# Patient Record
Sex: Male | Born: 1980 | Race: Black or African American | Hispanic: No | Marital: Single | Smoking: Never smoker
Health system: Southern US, Community
[De-identification: ages and names within clinical notes are randomized; demographics above are authoritative.]

## PROBLEM LIST (undated history)

## (undated) DIAGNOSIS — I10 Essential (primary) hypertension: Secondary | ICD-10-CM

## (undated) DIAGNOSIS — I2699 Other pulmonary embolism without acute cor pulmonale: Secondary | ICD-10-CM

---

## 2006-10-20 ENCOUNTER — Emergency Department (HOSPITAL_COMMUNITY): Admission: EM | Admit: 2006-10-20 | Discharge: 2006-10-20 | Payer: Self-pay | Admitting: Emergency Medicine

## 2009-03-08 ENCOUNTER — Emergency Department (HOSPITAL_COMMUNITY): Admission: EM | Admit: 2009-03-08 | Discharge: 2009-03-08 | Payer: Self-pay | Admitting: Emergency Medicine

## 2010-04-08 LAB — BASIC METABOLIC PANEL
BUN: 15 mg/dL (ref 6–23)
Chloride: 100 mEq/L (ref 96–112)
Creatinine, Ser: 1 mg/dL (ref 0.4–1.5)
GFR calc non Af Amer: >60 mL/min (ref >60)
Glucose, Bld: 91 mg/dL (ref 70–99)
Potassium: 3.9 mEq/L (ref 3.5–5.1)
Sodium: 137 mEq/L (ref 135–145)

## 2010-04-08 LAB — DIFFERENTIAL
Basophils Relative: 1 % (ref 0–1)
Eosinophils Absolute: 0.2 10*3/uL (ref 0.0–0.7)
Monocytes Absolute: 0.4 10*3/uL (ref 0.1–1.0)
Neutrophils Relative %: 54 % (ref 43–77)

## 2010-04-08 LAB — CK TOTAL AND CKMB (NOT AT ARMC)
Relative Index: 0.2 (ref 0.0–2.5)
Total CK: 474 U/L — ABNORMAL HIGH (ref 7–232)

## 2010-04-08 LAB — CBC
HCT: 47.1 % (ref 39.0–52.0)
MCV: 89.9 fL (ref 78.0–100.0)
Platelets: 250 10*3/uL (ref 150–400)
RDW: 12.4 % (ref 11.5–15.5)

## 2010-04-08 LAB — TROPONIN I: Troponin I: 0.02 ng/mL (ref 0.00–0.06)

## 2010-04-08 LAB — D-DIMER, QUANTITATIVE: D-Dimer, Quant: 0.54 ug/mL-FEU — ABNORMAL HIGH (ref 0.00–0.48)

## 2010-10-27 LAB — RAPID STREP SCREEN (MED CTR MEBANE ONLY): Streptococcus, Group A Screen (Direct): NEGATIVE

## 2011-02-13 ENCOUNTER — Emergency Department: Payer: Self-pay | Admitting: Emergency Medicine

## 2012-10-10 DIAGNOSIS — R739 Hyperglycemia, unspecified: Secondary | ICD-10-CM | POA: Insufficient documentation

## 2012-10-19 DIAGNOSIS — R6510 Systemic inflammatory response syndrome (SIRS) of non-infectious origin without acute organ dysfunction: Secondary | ICD-10-CM | POA: Insufficient documentation

## 2012-10-19 DIAGNOSIS — I2699 Other pulmonary embolism without acute cor pulmonale: Secondary | ICD-10-CM | POA: Insufficient documentation

## 2012-10-30 DIAGNOSIS — I10 Essential (primary) hypertension: Secondary | ICD-10-CM | POA: Insufficient documentation

## 2017-11-23 ENCOUNTER — Other Ambulatory Visit: Payer: Self-pay

## 2017-11-23 ENCOUNTER — Emergency Department
Admission: EM | Admit: 2017-11-23 | Discharge: 2017-11-23 | Disposition: A | Discharge status: Home / Self Care | Payer: No Typology Code available for payment source | Attending: Emergency Medicine | Admitting: Emergency Medicine

## 2017-11-23 ENCOUNTER — Encounter: Payer: Self-pay | Admitting: Emergency Medicine

## 2017-11-23 ENCOUNTER — Emergency Department: Payer: No Typology Code available for payment source

## 2017-11-23 DIAGNOSIS — M25512 Pain in left shoulder: Secondary | ICD-10-CM | POA: Diagnosis present

## 2017-11-23 DIAGNOSIS — I10 Essential (primary) hypertension: Secondary | ICD-10-CM | POA: Diagnosis not present

## 2017-11-23 HISTORY — DX: Essential (primary) hypertension: I10

## 2017-11-23 MED ORDER — TRAMADOL HCL 50 MG PO TABS
50.0000 mg | ORAL_TABLET | Freq: Once | ORAL | Status: AC
  Administered 2017-11-23: 50 mg via ORAL
  Filled 2017-11-23: qty 1

## 2017-11-23 MED ORDER — CYCLOBENZAPRINE HCL 5 MG PO TABS: ORAL_TABLET | ORAL | 0 refills | Status: DC

## 2017-11-23 MED ORDER — NAPROXEN 500 MG PO TABS: 500.0000 mg | ORAL_TABLET | Freq: Two times a day (BID) | ORAL | 0 refills | Status: DC
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
## 2017-11-29 ENCOUNTER — Ambulatory Visit
Admission: EM | Admit: 2017-11-29 | Discharge: 2017-11-29 | Disposition: A | Discharge status: Home / Self Care | Payer: Self-pay | Attending: Family Medicine | Admitting: Family Medicine

## 2017-11-29 ENCOUNTER — Encounter: Payer: Self-pay | Admitting: Gynecology

## 2017-11-29 DIAGNOSIS — S46812A Strain of other muscles, fascia and tendons at shoulder and upper arm level, left arm, initial encounter: Secondary | ICD-10-CM

## 2017-11-29 DIAGNOSIS — Y92410 Unspecified street and highway as the place of occurrence of the external cause: Secondary | ICD-10-CM

## 2017-11-29 DIAGNOSIS — M545 Low back pain, unspecified: Secondary | ICD-10-CM

## 2017-11-29 MED ORDER — MELOXICAM 15 MG PO TABS: 15.0000 mg | ORAL_TABLET | Freq: Every day | ORAL | 0 refills | Status: DC | PRN

## 2017-11-29 MED ORDER — METAXALONE 800 MG PO TABS: 800.0000 mg | ORAL_TABLET | Freq: Three times a day (TID) | ORAL | 0 refills | Status: DC | PRN

## 2017-11-29 NOTE — Discharge Instructions (Signed)
Meds as prescribed. ° °Take care ° °Dr. Jdyn Parkerson  °
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
## 2018-03-16 ENCOUNTER — Emergency Department: Payer: Self-pay

## 2018-03-16 ENCOUNTER — Inpatient Hospital Stay
Admission: EM | Admit: 2018-03-16 | Discharge: 2018-03-17 | DRG: 176 | Disposition: A | Discharge status: Home / Self Care | Payer: Self-pay | Attending: Internal Medicine | Admitting: Internal Medicine

## 2018-03-16 ENCOUNTER — Other Ambulatory Visit: Payer: Self-pay

## 2018-03-16 ENCOUNTER — Encounter: Payer: Self-pay | Admitting: Emergency Medicine

## 2018-03-16 DIAGNOSIS — D751 Secondary polycythemia: Secondary | ICD-10-CM | POA: Diagnosis present

## 2018-03-16 DIAGNOSIS — I2699 Other pulmonary embolism without acute cor pulmonale: Principal | ICD-10-CM | POA: Diagnosis present

## 2018-03-16 DIAGNOSIS — R079 Chest pain, unspecified: Secondary | ICD-10-CM

## 2018-03-16 DIAGNOSIS — I10 Essential (primary) hypertension: Secondary | ICD-10-CM | POA: Diagnosis present

## 2018-03-16 DIAGNOSIS — X58XXXA Exposure to other specified factors, initial encounter: Secondary | ICD-10-CM | POA: Diagnosis present

## 2018-03-16 DIAGNOSIS — I429 Cardiomyopathy, unspecified: Secondary | ICD-10-CM | POA: Diagnosis present

## 2018-03-16 DIAGNOSIS — Z79899 Other long term (current) drug therapy: Secondary | ICD-10-CM

## 2018-03-16 DIAGNOSIS — Z91120 Patient's intentional underdosing of medication regimen due to financial hardship: Secondary | ICD-10-CM

## 2018-03-16 DIAGNOSIS — Z86711 Personal history of pulmonary embolism: Secondary | ICD-10-CM

## 2018-03-16 DIAGNOSIS — T45516A Underdosing of anticoagulants, initial encounter: Secondary | ICD-10-CM | POA: Diagnosis present

## 2018-03-16 DIAGNOSIS — Z888 Allergy status to other drugs, medicaments and biological substances status: Secondary | ICD-10-CM

## 2018-03-16 DIAGNOSIS — J181 Lobar pneumonia, unspecified organism: Secondary | ICD-10-CM

## 2018-03-16 DIAGNOSIS — R739 Hyperglycemia, unspecified: Secondary | ICD-10-CM | POA: Diagnosis present

## 2018-03-16 DIAGNOSIS — J189 Pneumonia, unspecified organism: Secondary | ICD-10-CM

## 2018-03-16 DIAGNOSIS — Z7982 Long term (current) use of aspirin: Secondary | ICD-10-CM

## 2018-03-16 HISTORY — DX: Other pulmonary embolism without acute cor pulmonale: I26.99

## 2018-03-16 LAB — HEPATIC FUNCTION PANEL
ALK PHOS: 62 U/L (ref 38–126)
ALT: 14 U/L (ref 0–44)
AST: 18 U/L (ref 15–41)
Albumin: 4.7 g/dL (ref 3.5–5.0)
BILIRUBIN TOTAL: 0.9 mg/dL (ref 0.3–1.2)
Bilirubin, Direct: 0.3 mg/dL — ABNORMAL HIGH (ref 0.0–0.2)
Indirect Bilirubin: 0.6 mg/dL (ref 0.3–0.9)
Total Protein: 10.2 g/dL — ABNORMAL HIGH (ref 6.5–8.1)

## 2018-03-16 LAB — URINALYSIS, ROUTINE W REFLEX MICROSCOPIC
Bacteria, UA: NONE SEEN
Bilirubin Urine: NEGATIVE
GLUCOSE, UA: NEGATIVE mg/dL
KETONES UR: 20 mg/dL — AB
Leukocytes,Ua: NEGATIVE
Nitrite: NEGATIVE
PH: 6 (ref 5.0–8.0)
Protein, ur: 100 mg/dL — AB
Specific Gravity, Urine: >1.046 — ABNORMAL HIGH (ref 1.005–1.030)
Squamous Epithelial / HPF: NONE SEEN (ref 0–5)

## 2018-03-16 LAB — PROTIME-INR
INR: 1.1 (ref 0.8–1.2)
Prothrombin Time: 14.1 seconds (ref 11.4–15.2)

## 2018-03-16 LAB — BASIC METABOLIC PANEL
ANION GAP: 13 (ref 5–15)
BUN: 17 mg/dL (ref 6–20)
CHLORIDE: 99 mmol/L (ref 98–111)
CO2: 26 mmol/L (ref 22–32)
Calcium: 10 mg/dL (ref 8.9–10.3)
Creatinine, Ser: 1.29 mg/dL — ABNORMAL HIGH (ref 0.61–1.24)
GFR calc non Af Amer: >60 mL/min (ref >60)
GLUCOSE: 109 mg/dL — AB (ref 70–99)
Potassium: 3.9 mmol/L (ref 3.5–5.1)
Sodium: 138 mmol/L (ref 135–145)

## 2018-03-16 LAB — TROPONIN I
Troponin I: 0.05 ng/mL (ref <0.03)
Troponin I: <0.03 ng/mL (ref <0.03)

## 2018-03-16 LAB — CBC
HEMATOCRIT: 52.6 % — AB (ref 39.0–52.0)
Hemoglobin: 17.7 g/dL — ABNORMAL HIGH (ref 13.0–17.0)
MCH: 29.3 pg (ref 26.0–34.0)
MCHC: 33.7 g/dL (ref 30.0–36.0)
MCV: 87.1 fL (ref 80.0–100.0)
NRBC: 0 % (ref 0.0–0.2)
Platelets: 280 10*3/uL (ref 150–400)
RBC: 6.04 MIL/uL — AB (ref 4.22–5.81)
RDW: 12 % (ref 11.5–15.5)
WBC: 11 10*3/uL — ABNORMAL HIGH (ref 4.0–10.5)

## 2018-03-16 LAB — MAGNESIUM: Magnesium: 2.1 mg/dL (ref 1.7–2.4)

## 2018-03-16 LAB — APTT: APTT: 31 s (ref 24–36)

## 2018-03-16 LAB — PHOSPHORUS: Phosphorus: 3.3 mg/dL (ref 2.5–4.6)

## 2018-03-16 LAB — LIPASE, BLOOD: Lipase: 29 U/L (ref 11–51)

## 2018-03-16 MED ORDER — ENOXAPARIN SODIUM 120 MG/0.8ML ~~LOC~~ SOLN
1.0000 mg/kg | Freq: Two times a day (BID) | SUBCUTANEOUS | Status: DC
  Administered 2018-03-17: 105 mg via SUBCUTANEOUS
  Filled 2018-03-16: qty 0.8

## 2018-03-16 MED ORDER — LACTATED RINGERS IV SOLN
INTRAVENOUS | Status: AC
  Administered 2018-03-17: via INTRAVENOUS

## 2018-03-16 MED ORDER — ASPIRIN EC 325 MG PO TBEC
325.0000 mg | DELAYED_RELEASE_TABLET | Freq: Once | ORAL | Status: AC
  Administered 2018-03-16: 325 mg via ORAL
  Filled 2018-03-16: qty 1

## 2018-03-16 MED ORDER — MORPHINE SULFATE (PF) 2 MG/ML IV SOLN: 2.0000 mg | INTRAVENOUS | Status: DC | PRN

## 2018-03-16 MED ORDER — ASPIRIN EC 81 MG PO TBEC
81.0000 mg | DELAYED_RELEASE_TABLET | Freq: Once | ORAL | Status: DC
  Filled 2018-03-16: qty 1

## 2018-03-16 MED ORDER — MORPHINE SULFATE (PF) 4 MG/ML IV SOLN
INTRAVENOUS | Status: AC
  Administered 2018-03-17: 4 mg via INTRAVENOUS
  Filled 2018-03-16: qty 1

## 2018-03-16 MED ORDER — NITROGLYCERIN 0.4 MG SL SUBL: 0.4000 mg | SUBLINGUAL_TABLET | SUBLINGUAL | Status: DC | PRN

## 2018-03-16 MED ORDER — ASPIRIN 81 MG PO CHEW
81.0000 mg | CHEWABLE_TABLET | Freq: Every day | ORAL | Status: DC
  Administered 2018-03-17: 81 mg via ORAL
  Filled 2018-03-16: qty 1

## 2018-03-16 MED ORDER — IOHEXOL 350 MG/ML SOLN
75.0000 mL | Freq: Once | INTRAVENOUS | Status: AC | PRN
  Administered 2018-03-16: 75 mL via INTRAVENOUS

## 2018-03-16 MED ORDER — SODIUM CHLORIDE 0.9 % IV BOLUS
1000.0000 mL | Freq: Once | INTRAVENOUS | Status: AC
  Administered 2018-03-16: 1000 mL via INTRAVENOUS

## 2018-03-16 MED ORDER — SODIUM CHLORIDE 0.9% FLUSH
3.0000 mL | Freq: Once | INTRAVENOUS | Status: AC
  Administered 2018-03-16: 3 mL via INTRAVENOUS

## 2018-03-16 MED ORDER — BENZONATATE 100 MG PO CAPS
200.0000 mg | ORAL_CAPSULE | Freq: Once | ORAL | Status: AC
  Administered 2018-03-16: 200 mg via ORAL
  Filled 2018-03-16: qty 2

## 2018-03-16 MED ORDER — SODIUM CHLORIDE 0.9 % IV SOLN
500.0000 mg | Freq: Once | INTRAVENOUS | Status: AC
  Administered 2018-03-17: 500 mg via INTRAVENOUS
  Filled 2018-03-16 (×2): qty 500

## 2018-03-16 MED ORDER — SODIUM CHLORIDE 0.9 % IV SOLN
1.0000 g | INTRAVENOUS | Status: DC
  Filled 2018-03-16: qty 10

## 2018-03-16 MED ORDER — SODIUM CHLORIDE 0.9 % IV SOLN
500.0000 mg | INTRAVENOUS | Status: DC
  Filled 2018-03-16: qty 500

## 2018-03-16 MED ORDER — MORPHINE SULFATE (PF) 4 MG/ML IV SOLN
4.0000 mg | INTRAVENOUS | Status: DC | PRN
  Administered 2018-03-16 – 2018-03-17 (×3): 4 mg via INTRAVENOUS
  Filled 2018-03-16 (×2): qty 1

## 2018-03-16 MED ORDER — ENOXAPARIN SODIUM 120 MG/0.8ML ~~LOC~~ SOLN
1.0000 mg/kg | Freq: Once | SUBCUTANEOUS | Status: AC
  Administered 2018-03-16: 105 mg via SUBCUTANEOUS
  Filled 2018-03-16: qty 0.8

## 2018-03-16 MED ORDER — SODIUM CHLORIDE 0.9 % IV SOLN
1.0000 g | Freq: Once | INTRAVENOUS | Status: AC
  Administered 2018-03-16: 1 g via INTRAVENOUS
  Filled 2018-03-16: qty 10

## 2018-03-16 NOTE — ED Provider Notes (Signed)
[facility] Emergency Department Provider Note  Time seen: 8:20 PM  I have reviewed the triage vital signs and the nursing notes.   HISTORY  Chief Complaint Shortness of Breath    HPI Keith Norman is a 38 y.o. male the past medical history of hypertension presents emergency department for shortness of breath and left lower chest/left upper quadrant abdominal pain.  According to the patient for the past 4 days he has been experiencing pain in his left lower chest as well as left upper part of his abdomen.  Patient states he has been coughing with mucus production at times.  Denies any fever.  States he was feeling short of breath today which is what prompted his ER visit.  No history of heart disease.  No leg pain or swelling.  Largely negative review of systems.   Past Medical History:  Diagnosis Date  . Hypertension     There are no active problems to display for this patient.   History reviewed. No pertinent surgical history.  Prior to Admission medications   Medication Sig Start Date End Date Taking? Authorizing Provider  meloxicam (MOBIC) 15 MG tablet Take 1 tablet (15 mg total) by mouth daily as needed. 11/29/17   Tommie Sams, DO  metaxalone (SKELAXIN) 800 MG tablet Take 1 tablet (800 mg total) by mouth 3 (three) times daily as needed. 11/29/17   Tommie Sams, DO  Omega-3 Fatty Acids (FISH OIL) 1000 MG CAPS Take by mouth.    [provider]    No Known Allergies  Family History  Problem Relation Age of Onset  . Healthy Mother   . Healthy Father     Social History Social History   Tobacco Use  . Smoking status: Never Smoker  . Smokeless tobacco: Never Used  Substance Use Topics  . Alcohol use: Yes  . Drug use: Never    Review of Systems Constitutional: Negative for fever. Cardiovascular: Left lower chest discomfort/left upper quadrant pain Respiratory: Positive shortness of breath today. Gastrointestinal: Mild left  upper quadrant abdominal pain.  Negative for nausea vomiting or diarrhea Musculoskeletal: Negative for musculoskeletal complaints Skin: Negative for skin complaints  Neurological: Negative for headache All other ROS negative  ____________________________________________   PHYSICAL EXAM:  VITAL SIGNS: ED Triage Vitals  Enc Vitals Group     BP 03/16/18 1928 (!) 162/107     Pulse Rate 03/16/18 1928 (!) 105     Resp 03/16/18 1928 18     Temp 03/16/18 1928 97.8 F (36.6 C)     Temp Source 03/16/18 1928 Oral     SpO2 03/16/18 1928 99 %     Weight 03/16/18 1931 230 lb (104.3 kg)     Height 03/16/18 1931 6\' 4"  (1.93 m)     Head Circumference --      Peak Flow --      Pain Score 03/16/18 1931 10     Pain Loc --      Pain Edu? --      Excl. in GC? --    Constitutional: Alert and oriented. Well appearing and in no distress. Eyes: Normal exam ENT   Head: Normocephalic and atraumatic.   Mouth/Throat: Mucous membranes are moist. Cardiovascular: Normal rate, regular rhythm. No murmur Respiratory: Normal respiratory effort without tachypnea nor retractions. Breath sounds are clear Gastrointestinal: Soft and nontender. No distention. Musculoskeletal: Nontender with normal range of motion in all extremities. Neurologic:  Normal speech and language. No gross focal  neurologic deficits Skin:  Skin is warm, dry and intact.  Psychiatric: Mood and affect are normal.   ____________________________________________    EKG  EKG viewed and interpreted by myself shows a sinus tachycardia 101 bpm with a narrow QRS, left axis deviation, largely normal intervals, no concerning ST changes noted.  ____________________________________________    RADIOLOGY  Chest x-ray shows left lower lobe consolidation.  CT angiography the chest is fairly indeterminate, pneumonia versus pulmonary infarction.  ____________________________________________   INITIAL IMPRESSION / ASSESSMENT AND PLAN / ED  COURSE  Pertinent labs & imaging results that were available during my care of the patient were reviewed by me and considered in my medical decision making (see chart for details).  Patient presents emergency department for left lower chest discomfort/left upper quadrant domino discomfort x4 days now shortness of breath since this morning.  Differential would include pneumonia, upper respiratory infection, pleurisy, chest wall pain, ACS, gastric discomfort, gastritis.  Denies any nausea vomiting or diarrhea.  States he has been somewhat constipated recently but denies any significant abdominal distention or pain.  Patient states 3 years ago he had a pulmonary embolus.  Patient's labs are resulted showing a slight troponin elevation 0.05 which could be concerning for recurrent PE.  We obtain a CT angiography of the chest however due to motion radiology states he is unable to determine subsegmental PE.  No main pulmonary PE.  But the area in the left lower lobe is either pneumonia versus PE with pulmonary infarction.  Given the patient's history of PE 3 years ago this remains high in the differential.  Patient states he only took Xarelto for 4 days and then stopped taking it 3 years ago.  Patient has no fever minimal white blood cell count elevation.  He does have renal insufficiency and I do not believe he should undergo further CT imaging with contrast tonight.  We will admit the patient to the hospitalist service.  Cover with antibiotics, also cover with Lovenox.  Patient agreeable to plan of care.  ____________________________________________   FINAL CLINICAL IMPRESSION(S) / ED DIAGNOSES  Left lower chest pain Community-acquired pneumonia   Minna Antis, MD 03/16/18 2241

## 2018-03-16 NOTE — ED Notes (Signed)
Spoke with admitting provider about pt trending BP, no further orders at this time .

## 2018-03-16 NOTE — ED Notes (Signed)
ED Provider at bedside. 

## 2018-03-16 NOTE — ED Notes (Signed)
admitting Provider at bedside. 

## 2018-03-16 NOTE — ED Notes (Signed)
ED TO INPATIENT HANDOFF REPORT  ED Nurse Name and Phone #: [phone]/Devory Mckinzie  S Name/Age/Gender Keith Norman 38 y.o. male Room/Bed: ED17A/ED17A  Code Status   Code Status: Not on file  Home/SNF/Other Home Patient oriented to: self, place, time and situation Is this baseline? Yes   Triage Complete: Triage complete  Chief Complaint SOB  Triage Note Pt arrives POV and ambulatory to triage with c/o SOB x 3 days with pain in his upper left chest today. Pt is diaphoretic at this time in triage but is otherwise in NAD.    Allergies Allergies  Allergen Reactions  . Other Anaphylaxis    Throat closes, gets hives. All nuts  . Lac Bov[place] Diarrhea    Level of Care/Admitting Diagnosis ED Disposition    ED Disposition Condition Comment   Admit  Hospital Area: [facility] [[id number]]  Level of Care: Telemetry [5]  Diagnosis: Chest pain [161096]  Admitting Physician: Barbaraann Rondo [[id number]]  Attending Physician: Barbaraann Rondo [[id number]]  Estimated length of stay: past midnight tomorrow  Certification:: I certify this patient will need inpatient services for at least 2 midnights  PT Class (Do Not Modify): Inpatient [101]  PT Acc Code (Do Not Modify): Private [1]       B Medical/Surgery History Past Medical History:  Diagnosis Date  . Hypertension   . Pulmonary embolism (HCC)    History reviewed. No pertinent surgical history.   A IV Location/Drains/Wounds Patient Lines/Drains/Airways Status   Active Line/Drains/Airways    Name:   Placement date:   Placement time:   Site:   Days:   Peripheral IV 03/16/18 Left Antecubital   03/16/18    2115    Antecubital   less than 1          Intake/Output Last 24 hours No intake or output data in the 24 hours ending 03/16/18 2320  Labs/Imaging Results for orders placed or performed during the hospital encounter of 03/16/18 (from the past 48 hour(s))  Basic metabolic panel     Status: Abnormal   Collection Time: 03/16/18  7:36 PM  Result Value Ref Range   Sodium 138 135 - 145 mmol/L   Potassium 3.9 3.5 - 5.1 mmol/L   Chloride 99 98 - 111 mmol/L   CO2 26 22 - 32 mmol/L   Glucose, Bld 109 (H) 70 - 99 mg/dL   BUN 17 6 - 20 mg/dL   Creatinine, Ser 2.95 (H) 0.61 - 1.24 mg/dL   Calcium 62.1 8.9 - 30.8 mg/dL   GFR calc non Af Amer >60 >60 mL/min   GFR calc Af Amer >60 >60 mL/min   Anion gap 13 5 - 15    Comment: Performed at [facility], [address] [place], [place] [address]  CBC     Status: Abnormal   Collection Time: 03/16/18  7:36 PM  Result Value Ref Range   WBC 11.0 (H) 4.0 - 10.5 K/uL   RBC 6.04 (H) 4.22 - 5.81 MIL/uL   Hemoglobin 17.7 (H) 13.0 - 17.0 g/dL   HCT 69.6 (H) 29.5 - 28.4 %   MCV 87.1 80.0 - 100.0 fL   MCH 29.3 26.0 - 34.0 pg   MCHC 33.7 30.0 - 36.0 g/dL   RDW 13.2 44.0 - 10.2 %   Platelets 280 150 - 400 K/uL   nRBC 0.0 0.0 - 0.2 %    Comment: Performed at [facility], [address], [place], [place] [address]  Troponin I -  ONCE - STAT     Status: Abnormal   Collection Time: 03/16/18  7:36 PM  Result Value Ref Range   Troponin I 0.05 (HH) <0.03 ng/mL    Comment: CRITICAL RESULT CALLED TO, READ BACK BY AND VERIFIED WITH [facility] FERGUSON RN AT 2045 03/16/2018.MSS Performed at [facility], [address] [place], [place] [address]   Hepatic function panel     Status: Abnormal   Collection Time: 03/16/18  7:36 PM  Result Value Ref Range   Total Protein 10.2 (H) 6.5 - 8.1 g/dL   Albumin 4.7 3.5 - 5.0 g/dL   AST 18 15 - 41 U/L   ALT 14 0 - 44 U/L   Alkaline Phosphatase 62 38 - 126 U/L   Total Bilirubin 0.9 0.3 - 1.2 mg/dL   Bilirubin, Direct 0.3 (H) 0.0 - 0.2 mg/dL   Indirect Bilirubin 0.6 0.3 - 0.9 mg/dL    Comment: Performed at [facility], [address] [place], [place] [address]  Lipase, blood     Status: None   Collection Time: 03/16/18  7:36 PM  Result Value Ref Range   Lipase 29 11 - 51 U/L     Comment: Performed at [facility], [address] [place], [place] [address]  APTT     Status: None   Collection Time: 03/16/18  7:37 PM  Result Value Ref Range   aPTT 31 24 - 36 seconds    Comment: Performed at [facility], [address] [place], [place] [address]  Protime-INR     Status: None   Collection Time: 03/16/18  7:37 PM  Result Value Ref Range   Prothrombin Time 14.1 11.4 - 15.2 seconds   INR 1.1 0.8 - 1.2    Comment: (NOTE) INR goal varies based on device and disease states. Performed at [facility], [address] [place], [place] [address]    Dg Chest 2 View  Result Date: 03/16/2018 CLINICAL DATA:  38 year old male with shortness of breath and cough for 3 days. EXAM: CHEST - 2 VIEW COMPARISON:  03/08/2009 FINDINGS: LEFT LOWER lobe consolidation is compatible with pneumonia. The cardiomediastinal silhouette is unremarkable. No pleural effusion, pneumothorax or acute bony abnormality. IMPRESSION: LEFT LOWER lobe consolidation likely represent pneumonia. Electronically Signed   By: Harmon Pier M.D.   On: 03/16/2018 20:26   Ct Angio Chest Pe W And/or Wo Contrast  Addendum Date: 03/16/2018   ADDENDUM REPORT: 03/16/2018 23:00 ADDENDUM: Please note additional imaging with better breath hold has been submitted. The described hypodensity in the left lower lobe pulmonary artery involving segmental and subsegmental branches appear real and represents a pulmonary embolus. The described triangular pleural-based density at the left lung base is most consistent with an area of infarct. These results were called by telephone at the time of interpretation on 03/16/2018 at 11:00 pm to Dr. Minna Antis , who verbally acknowledged these results. Electronically Signed   By: Elgie Collard M.D.   On: 03/16/2018 23:00   Result Date: 03/16/2018 CLINICAL DATA:  38 year old male with left-sided chest pain. EXAM: CT ANGIOGRAPHY CHEST WITH CONTRAST TECHNIQUE:  Multidetector CT imaging of the chest was performed using the standard protocol during bolus administration of intravenous contrast. Multiplanar CT image reconstructions and MIPs were obtained to evaluate the vascular anatomy. CONTRAST:  41mL OMNIPAQUE IOHEXOL 350 MG/ML SOLN COMPARISON:  Chest radiograph dated 03/16/2018 FINDINGS: Evaluation of this exam is limited due to respiratory motion artifact. Cardiovascular: There is mild  cardiomegaly with possible mild left ventricular hypertrophy. Correlation with echocardiogram recommended. No pericardial effusion. The thoracic aorta is unremarkable. Evaluation of the pulmonary arteries is very limited due to severe respiratory motion artifact and suboptimal visualization of the peripheral branches. An apparent small filling defect in the left lower lobe subsegmental branch (series 5 image [id number]) may be artifactual or represent a small pulmonary embolus. A V/Q scan may provide better evaluation if there is high clinical concern for acute PE. There is no CT evidence of right heart straining. Mediastinum/Nodes: No hilar or mediastinal adenopathy. The esophagus is grossly unremarkable as visualized. No mediastinal fluid collection. Lungs/Pleura: There is an area of consolidative change in the left lower lobe which may represent pneumonia although an area of infarct related to pulmonary embolism is not entirely excluded. Clinical correlation is recommended. Right lung base linear atelectasis versus infiltrate noted. There is a small left pleural effusion. No pneumothorax. The central airways are patent. Upper Abdomen: No acute abnormality. Musculoskeletal: No chest wall abnormality. No acute or significant osseous findings. Review of the MIP images confirms the above findings. IMPRESSION: 1. Very limited, almost nondiagnostic study for evaluation of pulmonary embolism due to severe respiratory motion artifact. An apparent filling defect within a left lower lobe subsegmental  pulmonary artery branch may be artifactual or represent a pulmonary embolus. Clinical correlation is recommended. V/Q scan may provide better evaluation if there is high clinical concern for acute PE. 2. Left lower lobe consolidation which may represent pneumonia although pulmonary infarct is not excluded. 3. Small left pleural effusion. 4. Mild cardiomegaly with findings of left ventricular hypertrophy. Correlation with echocardiogram and EKG recommended. These results were called by telephone at the time of interpretation on 03/16/2018 at 10:16 pm to Dr. Minna Antis , who verbally acknowledged these results. Electronically Signed: By: Elgie Collard M.D. On: 03/16/2018 22:25    Pending Labs Unresulted Labs (From admission, onward)    Start     Ordered   03/17/18 0500  Procalcitonin  Daily,   STAT     03/16/18 2255   03/17/18 0200  Troponin I - Once  Once,   STAT    Question:  Specimen collection method  Answer:  Unit=Unit collect   03/16/18 2252   03/16/18 2300  Troponin I - Once  Once,   STAT    Question:  Specimen collection method  Answer:  Unit=Unit collect   03/16/18 2252   03/16/18 2256  Procalcitonin - Baseline  Add-on,   AD     03/16/18 2255   03/16/18 2255  HIV antibody (Routine Screening)  Once,   STAT     03/16/18 2255   03/16/18 2255  Culture, sputum-assessment  Once,   STAT     03/16/18 2255   03/16/18 2255  Gram stain  Once,   STAT     03/16/18 2255   03/16/18 2255  Strep pneumoniae urinary antigen  Once,   STAT     03/16/18 2255   03/16/18 2255  Legionella Pneumophila Serogp 1 Ur Ag  Once,   STAT     03/16/18 2255   03/16/18 2252  Magnesium  Add-on,   AD    Question:  Specimen collection method  Answer:  Unit=Unit collect   03/16/18 2252   03/16/18 2252  Phosphorus  Add-on,   AD    Question:  Specimen collection method  Answer:  Unit=Unit collect   03/16/18 2252   03/16/18 2249  Urinalysis, Routine w reflex microscopic  Once,   STAT     03/16/18 2249    03/16/18 2234  Influenza panel by PCR (type A & B)  (Influenza PCR Panel)  Once,   STAT     03/16/18 2233   Signed and Held  Basic metabolic panel  Tomorrow morning,   R     Signed and Held   Signed and Held  CBC  Tomorrow morning,   R     Signed and Held          Vitals/Pain Today's Vitals   03/16/18 2115 03/16/18 2208 03/16/18 2215 03/16/18 2300  BP: (!) 141/101 (!) 154/105 (!) 150/102 (!) 158/104  Pulse: 99 98 90 87  Resp: 18 (!) Temp:      TempSrc:      SpO2: 98% 96% 99% 98%  Weight:      Height:      PainSc:        Isolation Precautions No active isolations  Medications Medications  cefTRIAXone (ROCEPHIN) 1 g in sodium chloride 0.9 % 100 mL IVPB (1 g Intravenous New Bag/Given 03/16/18 2313)  azithromycin (ZITHROMAX) 500 mg in sodium chloride 0.9 % 250 mL IVPB (has no administration in time range)  aspirin chewable tablet 81 mg (has no administration in time range)  cefTRIAXone (ROCEPHIN) 1 g in sodium chloride 0.9 % 100 mL IVPB (has no administration in time range)  azithromycin (ZITHROMAX) 500 mg in sodium chloride 0.9 % 250 mL IVPB (has no administration in time range)  morphine 2 MG/ML injection 2 mg ( Intravenous See Alternative 03/16/18 2313)    Or  morphine 4 MG/ML injection 4 mg (4 mg Intravenous Given 03/16/18 2313)  nitroGLYCERIN (NITROSTAT) SL tablet 0.4 mg (has no administration in time range)  enoxaparin (LOVENOX) injection 105 mg (has no administration in time range)  lactated ringers infusion (has no administration in time range)  sodium chloride flush (NS) 0.9 % injection 3 mL (3 mLs Intravenous Given 03/16/18 2156)  iohexol (OMNIPAQUE) 350 MG/ML injection 75 mL (75 mLs Intravenous Contrast Given 03/16/18 2125)  sodium chloride 0.9 % bolus 1,000 mL (0 mLs Intravenous Stopped 03/16/18 2300)  benzonatate (TESSALON) capsule 200 mg (200 mg Oral Given 03/16/18 2208)  enoxaparin (LOVENOX) injection 105 mg (105 mg Subcutaneous Given 03/16/18 2314)  aspirin  EC tablet 325 mg (325 mg Oral Given 03/16/18 2314)    Mobility walks Low fall risk   Focused Assessments Cardiac Assessment Handoff:    Lab Results  Component Value Date   CKTOTAL 474 (H) 03/08/2009   CKMB 0.8 03/08/2009   TROPONINI 0.05 (HH) 03/16/2018   Lab Results  Component Value Date   DDIMER (H) 03/08/2009    0.54        AT THE INHOUSE ESTABLISHED CUTOFF VALUE OF 0.48 ug/mL FEU, THIS ASSAY HAS BEEN DOCUMENTED IN THE LITERATURE TO HAVE A SENSITIVITY AND NEGATIVE PREDICTIVE VALUE OF AT LEAST 98 TO 99%.  THE TEST RESULT SHOULD BE CORRELATED WITH AN ASSESSMENT OF THE CLINICAL PROBABILITY OF DVT / VTE.   Does the Patient currently have chest pain? No  , Pulmonary Assessment Handoff:  Lung sounds:  clear O2 Device: Room Air        R Recommendations: See Admitting Provider Note  Report given to:   Additional Notes: Pt has hx of PE, alert/oriented, dry cough. Left lateral rib pain, morphine given for pain.

## 2018-03-16 NOTE — ED Triage Notes (Signed)
Pt arrives POV and ambulatory to triage with c/o SOB x 3 days with pain in his upper left chest today. Pt is diaphoretic at this time in triage but is otherwise in NAD.

## 2018-03-17 ENCOUNTER — Other Ambulatory Visit: Payer: Self-pay

## 2018-03-17 ENCOUNTER — Inpatient Hospital Stay (HOSPITAL_COMMUNITY)
Admit: 2018-03-17 | Discharge: 2018-03-17 | Disposition: A | Discharge status: Home / Self Care | Payer: Self-pay | Attending: Internal Medicine | Admitting: Internal Medicine

## 2018-03-17 DIAGNOSIS — I340 Nonrheumatic mitral (valve) insufficiency: Secondary | ICD-10-CM

## 2018-03-17 DIAGNOSIS — I351 Nonrheumatic aortic (valve) insufficiency: Secondary | ICD-10-CM

## 2018-03-17 LAB — ECHOCARDIOGRAM COMPLETE
HEIGHTINCHES: 76 in
Weight: 3638.4 oz

## 2018-03-17 LAB — INFLUENZA PANEL BY PCR (TYPE A & B)
INFLAPCR: NEGATIVE
Influenza B By PCR: NEGATIVE

## 2018-03-17 LAB — PROCALCITONIN: Procalcitonin: <0.1 ng/mL

## 2018-03-17 LAB — TROPONIN I: Troponin I: <0.03 ng/mL (ref <0.03)

## 2018-03-17 LAB — STREP PNEUMONIAE URINARY ANTIGEN: Strep Pneumo Urinary Antigen: NEGATIVE

## 2018-03-17 MED ORDER — ACETAMINOPHEN 325 MG PO TABS: 650.0000 mg | ORAL_TABLET | Freq: Four times a day (QID) | ORAL | Status: DC | PRN

## 2018-03-17 MED ORDER — ONDANSETRON HCL 4 MG PO TABS: 4.0000 mg | ORAL_TABLET | Freq: Four times a day (QID) | ORAL | Status: DC | PRN

## 2018-03-17 MED ORDER — AMLODIPINE BESYLATE 5 MG PO TABS
5.0000 mg | ORAL_TABLET | Freq: Once | ORAL | Status: AC
  Administered 2018-03-17: 5 mg via ORAL
  Filled 2018-03-17: qty 1

## 2018-03-17 MED ORDER — APIXABAN 5 MG PO TABS: 5.0000 mg | ORAL_TABLET | Freq: Two times a day (BID) | ORAL | Status: DC

## 2018-03-17 MED ORDER — ONDANSETRON HCL 4 MG/2ML IJ SOLN: 4.0000 mg | Freq: Four times a day (QID) | INTRAMUSCULAR | Status: DC | PRN

## 2018-03-17 MED ORDER — BISACODYL 5 MG PO TBEC: 5.0000 mg | DELAYED_RELEASE_TABLET | Freq: Every day | ORAL | Status: DC | PRN

## 2018-03-17 MED ORDER — AMLODIPINE BESYLATE 5 MG PO TABS: 5.0000 mg | ORAL_TABLET | Freq: Every day | ORAL | Status: DC

## 2018-03-17 MED ORDER — SENNOSIDES-DOCUSATE SODIUM 8.6-50 MG PO TABS: 1.0000 | ORAL_TABLET | Freq: Every evening | ORAL | Status: DC | PRN

## 2018-03-17 MED ORDER — AMLODIPINE BESYLATE 5 MG PO TABS: 5.0000 mg | ORAL_TABLET | Freq: Every day | ORAL | 0 refills | Status: DC

## 2018-03-17 MED ORDER — APIXABAN 5 MG PO TABS: 10.0000 mg | ORAL_TABLET | Freq: Two times a day (BID) | ORAL | Status: DC

## 2018-03-17 MED ORDER — ELIQUIS 5 MG VTE STARTER PACK: ORAL_TABLET | ORAL | 0 refills | Status: DC

## 2018-03-17 MED ORDER — ACETAMINOPHEN 650 MG RE SUPP: 650.0000 mg | Freq: Four times a day (QID) | RECTAL | Status: DC | PRN

## 2018-03-17 MED ORDER — APIXABAN 5 MG PO TABS
10.0000 mg | ORAL_TABLET | Freq: Two times a day (BID) | ORAL | Status: DC
  Administered 2018-03-17: 10 mg via ORAL
  Filled 2018-03-17: qty 2

## 2018-03-17 NOTE — Progress Notes (Signed)
*  PRELIMINARY RESULTS* Echocardiogram 2D Echocardiogram has been performed.  Keith Norman 03/17/2018, 11:04 AM

## 2018-03-17 NOTE — Plan of Care (Signed)
  Problem: Clinical Measurements: Goal: Ability to maintain clinical measurements within normal limits will improve Outcome: Progressing   Problem: Activity: Goal: Risk for activity intolerance will decrease Outcome: Progressing   Problem: Pain Managment: Goal: General experience of comfort will improve Outcome: Progressing   Problem: Safety: Goal: Ability to remain free from injury will improve Outcome: Progressing   Problem: Clinical Measurements: Goal: Ability to maintain a body temperature in the normal range will improve Outcome: Progressing   Problem: Respiratory: Goal: Ability to maintain a clear airway will improve Outcome: Progressing

## 2018-03-17 NOTE — Discharge Summary (Signed)
[facility] [place] [facility]   PATIENT NAME: Keith Norman    MR#:  [id number]  DATE OF BIRTH:  09-01-1980  DATE OF ADMISSION:  03/16/2018 ADMITTING PHYSICIAN: Barbaraann RondoPrasanna Sridharan, MD  DATE OF DISCHARGE: 03/17/2018   PRIMARY CARE PHYSICIAN: System, Pcp Not In    ADMISSION DIAGNOSIS:  Chest pain, unspecified type [R07.9] Community acquired pneumonia of left lower lobe of lung (HCC) [J18.1]  DISCHARGE DIAGNOSIS:  Active Problems:   Chest pain   Pulmonary embolus,  SECONDARY DIAGNOSIS:   Past Medical History:  Diagnosis Date  . Hypertension   . Pulmonary embolism [facility](HCC)     HOSPITAL COURSE:   Patient was admitted with chest pain and shortness of breath with minimal exertion after having a long drive without taking enough stops.  He was diagnosed to have pulmonary embolism on CT scan of the chest.  Started on Lovenox therapeutic dose.  Next day he felt much better and was comfortable and not much short of breath on exertion so requested to go home.  His chest pain is also subsided. Echocardiogram was done which did not show any strain on the right heart but showed dilated and low EF on left ventricle. As patient was feeling symptomatically much better, I would switch him to oral Eliquis and will discharge.  I am advising him to continue anticoagulation for lifelong this time or do not stop until advised by a physician. I am referring him to a cardiologist in the clinic for his decreased EF and cardiomyopathy.  His blood pressure was high so I have started on amlodipine.  Provided him with prescription.  DISCHARGE CONDITIONS:   Stable.  CONSULTS OBTAINED:  Treatment Team:  Barbaraann RondoSridharan, Prasanna, MD  DRUG ALLERGIES:   Allergies  Allergen Reactions  . Other Anaphylaxis    Throat closes, gets hives. All nuts  . Lac Bovis Diarrhea    DISCHARGE MEDICATIONS:   Allergies as of 03/17/2018      Reactions   Other Anaphylaxis   Throat closes,  gets hives. All nuts   Lac Bovis Diarrhea      Medication List    STOP taking these medications   Fish Oil 1000 MG Caps   Garlic 1000 MG Caps     TAKE these medications   amLODipine 5 MG tablet Commonly known as:  NORVASC Take 1 tablet (5 mg total) by mouth daily. Start taking on:  March 18, 2018   ELIQUIS DVT/PE STARTER PACK 5 MG Tabs Take as directed on package: start with two-5mg  tablets twice daily for 7 days. On day 8, switch to one-5mg  tablet twice daily.        DISCHARGE INSTRUCTIONS:    Stable.  If you experience worsening of your admission symptoms, develop shortness of breath, life threatening emergency, suicidal or homicidal thoughts you must seek medical attention immediately by calling 911 or calling your MD immediately  if symptoms less severe.  You Must read complete instructions/literature along with all the possible adverse reactions/side effects for all the Medicines you take and that have been prescribed to you. Take any new Medicines after you have completely understood and accept all the possible adverse reactions/side effects.   Please note  You were cared for by a hospitalist during your hospital stay. If you have any questions about your discharge medications or the care you received while you were in the hospital after you are discharged, you can call the unit and asked to speak with the hospitalist  on call if the hospitalist that took care of you is not available. Once you are discharged, your primary care physician will handle any further medical issues. Please note that NO REFILLS for any discharge medications will be authorized once you are discharged, as it is imperative that you return to your primary care physician (or establish a relationship with a primary care physician if you do not have one) for your aftercare needs so that they can reassess your need for medications and monitor your lab values.    Today   CHIEF COMPLAINT:   Chief  Complaint  Patient presents with  . Shortness of Breath    HISTORY OF PRESENT ILLNESS:  Keith Norman  is a 38 y.o. male with a known history of HTN, PE (2014; off systemic AC) p/w L lower CP, L lower back pain, SOB. Pt states that he drove to [place] on Sun 2/23, 7.5 hours, non-stop without breaks. He states that on Wed 2/26 he developed sharp pain in the L lower back and L lower ribs. Pain was mild and pt thought he had pulled a muscle. He states he drove back from [place] on Thurs 2/27. He made 2-3 stops on the way. He got home on Thurs evening, and noted his pain symptoms to be persistent. He also noticed the pain was worse when he coughed, moved/changed position, took a deep breath or strained to have a bowel movement. He states he did not have dyspnea until Fri 02/28 and Sat 02/29. He states the pain and dyspnea have been waxing and waning since onset. He endorses very minimal non-productive cough. He denies hemoptysis.  Pt tells me that he had a PE ~65yrs ago (per documentation, late 2014). He states he was on systemic AC x2-65mo. Pt states he was on Xarelto, but claims it was making him feel poorly ("lightheaded") and he was, as such, transitioned to daily aspirin. His documentation runs contrary to his claims, stating that he was recommended for indefinite systemic AC (@ [facility]), but lost insurance sometime in early 2017, at which time he stopped taking Xarelto. There is also documentation (an 09/12/2015 office visit note) that notes that the risk of recurrent PE was explained to the pt.  CTA chest report reads, "Very limited, almost nondiagnostic study for evaluation of pulmonary embolism due to severe respiratory motion artifact. An apparent filling defect within a left lower lobe subsegmental pulmonary artery branch may be artifactual or represent a pulmonary embolus. Clinical correlation is recommended. V/Q scan may provide better evaluation if there is high clinical concern for  acute PE. Left lower lobe consolidation which may represent pneumonia although pulmonary infarct is not excluded." V/Q scan pending. High suspicion for PE. Received therapeutic Lovenox.   VITAL SIGNS:  Blood pressure 130/88, pulse 80, temperature 98.5 F (36.9 C), temperature source Oral, resp. rate 16, height 6\' 4"  (1.93 m), weight 103.1 kg, SpO2 97 %.  I/O:    Intake/Output Summary (Last 24 hours) at 03/17/2018 1359 Last data filed at 03/17/2018 1335 Gross per 24 hour  Intake 1360 ml  Output 1375 ml  Net -15 ml    PHYSICAL EXAMINATION:  GENERAL:  38 y.o.-year-old patient lying in the bed with no acute distress.  EYES: Pupils equal, round, reactive to light and accommodation. No scleral icterus. Extraocular muscles intact.  HEENT: Head atraumatic, normocephalic. Oropharynx and nasopharynx clear.  NECK:  Supple, no jugular venous distention. No thyroid enlargement, no tenderness.  LUNGS: Normal breath sounds bilaterally, no wheezing, rales,rhonchi  or crepitation. No use of accessory muscles of respiration.  CARDIOVASCULAR: S1, S2 normal. No murmurs, rubs, or gallops.  ABDOMEN: Soft, non-tender, non-distended. Bowel sounds present. No organomegaly or mass.  EXTREMITIES: No pedal edema, cyanosis, or clubbing.  NEUROLOGIC: Cranial nerves II through XII are intact. Muscle strength 5/5 in all extremities. Sensation intact. Gait not checked.  PSYCHIATRIC: The patient is alert and oriented x 3.  SKIN: No obvious rash, lesion, or ulcer.   DATA REVIEW:   CBC Recent Labs  Lab 03/16/18 1936  WBC 11.0*  HGB 17.7*  HCT 52.6*  PLT 280    Chemistries  Recent Labs  Lab 03/16/18 1936 03/16/18 2301  NA 138  --   K 3.9  --   CL 99  --   CO2 26  --   GLUCOSE 109*  --   BUN 17  --   CREATININE 1.29*  --   CALCIUM 10.0  --   MG  --  2.1  AST 18  --   ALT 14  --   ALKPHOS 62  --   BILITOT 0.9  --     Cardiac Enzymes Recent Labs  Lab 03/17/18 0206  TROPONINI <0.03     Microbiology Results  Results for orders placed or performed during the hospital encounter of 10/20/06  Rapid strep screen     Status: None   Collection Time: 10/20/06  3:58 PM  Result Value Ref Range Status   Streptococcus, Group A Screen (Direct) NEGATIVE  Final    RADIOLOGY:  Dg Chest 2 View  Result Date: 03/16/2018 CLINICAL DATA:  38 year old male with shortness of breath and cough for 3 days. EXAM: CHEST - 2 VIEW COMPARISON:  03/08/2009 FINDINGS: LEFT LOWER lobe consolidation is compatible with pneumonia. The cardiomediastinal silhouette is unremarkable. No pleural effusion, pneumothorax or acute bony abnormality. IMPRESSION: LEFT LOWER lobe consolidation likely represent pneumonia. Electronically Signed   By: Harmon Pier M.D.   On: 03/16/2018 20:26   Ct Angio Chest Pe W And/or Wo Contrast  Addendum Date: 03/16/2018   ADDENDUM REPORT: 03/16/2018 23:00 ADDENDUM: Please note additional imaging with better breath hold has been submitted. The described hypodensity in the left lower lobe pulmonary artery involving segmental and subsegmental branches appear real and represents a pulmonary embolus. The described triangular pleural-based density at the left lung base is most consistent with an area of infarct. These results were called by telephone at the time of interpretation on 03/16/2018 at 11:00 pm to Dr. Minna Antis , who verbally acknowledged these results. Electronically Signed   By: Elgie Collard M.D.   On: 03/16/2018 23:00   Result Date: 03/16/2018 CLINICAL DATA:  38 year old male with left-sided chest pain. EXAM: CT ANGIOGRAPHY CHEST WITH CONTRAST TECHNIQUE: Multidetector CT imaging of the chest was performed using the standard protocol during bolus administration of intravenous contrast. Multiplanar CT image reconstructions and MIPs were obtained to evaluate the vascular anatomy. CONTRAST:  75mL OMNIPAQUE IOHEXOL 350 MG/ML SOLN COMPARISON:  Chest radiograph dated 03/16/2018  FINDINGS: Evaluation of this exam is limited due to respiratory motion artifact. Cardiovascular: There is mild cardiomegaly with possible mild left ventricular hypertrophy. Correlation with echocardiogram recommended. No pericardial effusion. The thoracic aorta is unremarkable. Evaluation of the pulmonary arteries is very limited due to severe respiratory motion artifact and suboptimal visualization of the peripheral branches. An apparent small filling defect in the left lower lobe subsegmental branch (series 5 image 154-167) may be artifactual or represent a small pulmonary embolus. A  V/Q scan may provide better evaluation if there is high clinical concern for acute PE. There is no CT evidence of right heart straining. Mediastinum/Nodes: No hilar or mediastinal adenopathy. The esophagus is grossly unremarkable as visualized. No mediastinal fluid collection. Lungs/Pleura: There is an area of consolidative change in the left lower lobe which may represent pneumonia although an area of infarct related to pulmonary embolism is not entirely excluded. Clinical correlation is recommended. Right lung base linear atelectasis versus infiltrate noted. There is a small left pleural effusion. No pneumothorax. The central airways are patent. Upper Abdomen: No acute abnormality. Musculoskeletal: No chest wall abnormality. No acute or significant osseous findings. Review of the MIP images confirms the above findings. IMPRESSION: 1. Very limited, almost nondiagnostic study for evaluation of pulmonary embolism due to severe respiratory motion artifact. An apparent filling defect within a left lower lobe subsegmental pulmonary artery branch may be artifactual or represent a pulmonary embolus. Clinical correlation is recommended. V/Q scan may provide better evaluation if there is high clinical concern for acute PE. 2. Left lower lobe consolidation which may represent pneumonia although pulmonary infarct is not excluded. 3. Small left  pleural effusion. 4. Mild cardiomegaly with findings of left ventricular hypertrophy. Correlation with echocardiogram and EKG recommended. These results were called by telephone at the time of interpretation on 03/16/2018 at 10:16 pm to Dr. Minna Antis , who verbally acknowledged these results. Electronically Signed: By: Elgie Collard M.D. On: 03/16/2018 22:25    EKG:   Orders placed or performed during the hospital encounter of 03/16/18  . EKG 12-Lead  . EKG 12-Lead  . ED EKG  . ED EKG      Management plans discussed with the patient, family and they are in agreement.  CODE STATUS: Full.    Code Status Orders  (From admission, onward)         Start     Ordered   03/17/18 0009  Full code  Continuous     03/17/18 0008        Code Status History    This patient has a current code status but no historical code status.    Advance Directive Documentation     Most Recent Value  Type of Advance Directive  Healthcare Power of Attorney, Living will  Pre-existing out of facility DNR order (yellow form or pink MOST form)  -  "MOST" Form in Place?  -      TOTAL TIME TAKING CARE OF THIS PATIENT: 35 minutes.    Altamese Dilling M.D on 03/17/2018 at 1:59 PM  Between 7am to 6pm - Pager - [phone]  After 6pm go to [email or web address] - password [email or web address]  [facility]  Office  [phone]  CC: Primary care physician; System, Pcp Not In   Note: This dictation was prepared with Dragon dictation along with smaller phrase technology. Any transcriptional errors that result from this process are unintentional.

## 2018-03-17 NOTE — Discharge Instructions (Signed)
Follow with PCP in 2 weeks. Continue Anticoagulants.

## 2018-03-17 NOTE — Progress Notes (Signed)
ANTICOAGULATION CONSULT NOTE   Pharmacy Consult for apixaban dosing  Indication: pulmonary embolus  Allergies  Allergen Reactions  . Other Anaphylaxis    Throat closes, gets hives. All nuts  . Lac Bovis Diarrhea    Patient Measurements: Height: 6\' 4"  (193 cm) Weight: 227 lb 6.4 oz (103.1 kg) IBW/kg (Calculated) : 86.8  Vital Signs: Temp: 98.5 F (36.9 C) (03/01 0749) Temp Source: Oral (03/01 0749) BP: 130/88 (03/01 0749) Pulse Rate: 80 (03/01 0749)  Labs: Recent Labs    03/16/18 1936 03/16/18 1937 03/16/18 2301 03/17/18 0206  HGB 17.7*  --   --   --   HCT 52.6*  --   --   --   PLT 280  --   --   --   APTT  --  31  --   --   LABPROT  --  14.1  --   --   INR  --  1.1  --   --   CREATININE 1.29*  --   --   --   TROPONINI 0.05*  --  <0.03 <0.03    Estimated Creatinine Clearance: 98.1 mL/min (A) (by C-G formula based on SCr of 1.29 mg/dL (H)).   Medications:  Scheduled:  . [START ON 03/18/2018] amLODipine  5 mg Oral Daily  . apixaban  10 mg Oral BID   Followed by  . [START ON 03/25/2018] apixaban  5 mg Oral BID  . aspirin  81 mg Oral Daily   Infusions:    Assessment: Pharmacy consulted for apixaban dosing for 38 yo male admitted with PE. Patient currently ordered enoxaparin 1mg /kg Q12hr. Patient last ordered enoxaparin at 1107.   Goal of Therapy:  Monitor platelets by anticoagulation protocol: Yes   Plan:  Will initiate apixaban 10mg  PO BID x 7 days followed by apixaban 5mg  BID. Will discontinue enoxaparin and schedule first dose of apixaban for 3/1 at 2200.   Pharmacy will continue to monitor and adjust per consult.   Giovanny Dugal L 03/17/2018,2:04 PM

## 2018-03-17 NOTE — H&P (Signed)
[employer] [place] at [facility]   PATIENT NAME: Keith Norman    MR#:  [id number]  DATE OF BIRTH:  1980/10/18  DATE OF ADMISSION:  03/16/2018  PRIMARY CARE PHYSICIAN: System, Pcp Not In   REQUESTING/REFERRING PHYSICIAN: Minna Antis, MD  CHIEF COMPLAINT:   Chief Complaint  Patient presents with  . Shortness of Breath    HISTORY OF PRESENT ILLNESS:  Keith Norman  is a 38 y.o. male with a known history of HTN, PE (2014; off systemic AC) p/w L lower CP, L lower back pain, SOB. Pt states that he drove to [place] on Sun 2/23, 7.5 hours, non-stop without breaks. He states that on Wed 2/26 he developed sharp pain in the L lower back and L lower ribs. Pain was mild and pt thought he had pulled a muscle. He states he drove back from [place] on Thurs 2/27. He made 2-3 stops on the way. He got home on Thurs evening, and noted his pain symptoms to be persistent. He also noticed the pain was worse when he coughed, moved/changed position, took a deep breath or strained to have a bowel movement. He states he did not have dyspnea until Fri 02/28 and Sat 02/29. He states the pain and dyspnea have been waxing and waning since onset. He endorses very minimal non-productive cough. He denies hemoptysis.  Pt tells me that he had a PE ~74yrs ago (per documentation, late 2014). He states he was on systemic AC x2-42mo. Pt states he was on Xarelto, but claims it was making him feel poorly ("lightheaded") and he was, as such, transitioned to daily aspirin. His documentation runs contrary to his claims, stating that he was recommended for indefinite systemic AC (@ [facility]), but lost insurance sometime in early 2017, at which time he stopped taking Xarelto. There is also documentation (an 09/12/2015 office visit note) that notes that the risk of recurrent PE was explained to the pt.  CTA chest report reads, "Very limited, almost nondiagnostic study for evaluation of pulmonary  embolism due to severe respiratory motion artifact. An apparent filling defect within a left lower lobe subsegmental pulmonary artery branch may be artifactual or represent a pulmonary embolus. Clinical correlation is recommended. V/Q scan may provide better evaluation if there is high clinical concern for acute PE. Left lower lobe consolidation which may represent pneumonia although pulmonary infarct is not excluded." V/Q scan pending. High suspicion for PE. Received therapeutic Lovenox.  PAST MEDICAL HISTORY:   Past Medical History:  Diagnosis Date  . Hypertension   . Pulmonary embolism (HCC)     PAST SURGICAL HISTORY:  History reviewed. No pertinent surgical history.  SOCIAL HISTORY:   Social History   Tobacco Use  . Smoking status: Never Smoker  . Smokeless tobacco: Never Used  Substance Use Topics  . Alcohol use: Yes    FAMILY HISTORY:   Family History  Problem Relation Age of Onset  . Healthy Mother   . Healthy Father     DRUG ALLERGIES:   Allergies  Allergen Reactions  . Other Anaphylaxis    Throat closes, gets hives. All nuts  . Lac Bovis Diarrhea    REVIEW OF SYSTEMS:   Review of Systems  Constitutional: Negative for chills, diaphoresis, fever, malaise/fatigue and weight loss.  HENT: Negative for congestion, ear pain, hearing loss, nosebleeds, sinus pain, sore throat and tinnitus.   Eyes: Negative for blurred vision, double vision and photophobia.  Respiratory: Positive for cough and shortness of breath.  Negative for hemoptysis, sputum production and wheezing.   Cardiovascular: Positive for chest pain. Negative for palpitations, orthopnea, claudication, leg swelling and PND.  Gastrointestinal: Negative for abdominal pain, blood in stool, constipation, diarrhea, heartburn, melena, nausea and vomiting.  Genitourinary: Negative for dysuria, frequency, hematuria and urgency.  Musculoskeletal: Positive for back pain. Negative for joint pain, myalgias and neck  pain.  Skin: Negative for itching and rash.  Neurological: Negative for dizziness, tingling, tremors, sensory change, speech change, focal weakness, seizures, loss of consciousness, weakness and headaches.  Psychiatric/Behavioral: Negative for depression and memory loss. The patient is not nervous/anxious and does not have insomnia.    MEDICATIONS AT HOME:   Prior to Admission medications   Medication Sig Start Date End Date Taking? Authorizing Provider  Garlic 1000 MG CAPS Take 1 capsule by mouth daily.   Yes [provider]  Omega-3 Fatty Acids (FISH OIL) 1000 MG CAPS Take 1 capsule by mouth daily.    Yes [provider]      VITAL SIGNS:  Blood pressure (!) 142/103, pulse 95, temperature 98.4 F (36.9 C), temperature source Oral, resp. rate 19, height 6\' 4"  (1.93 m), weight 104.3 kg, SpO2 96 %.  PHYSICAL EXAMINATION:  Physical Exam Constitutional:      General: He is not in acute distress.    Appearance: He is not ill-appearing or toxic-appearing.     Interventions: He is not intubated. HENT:     Head: Atraumatic.     Mouth/Throat:     Pharynx: Oropharynx is clear.  Eyes:     General: No scleral icterus.    Extraocular Movements: Extraocular movements intact.     Conjunctiva/sclera: Conjunctivae normal.  Neck:     Musculoskeletal: Neck supple.  Cardiovascular:     Rate and Rhythm: Normal rate and regular rhythm.  No extrasystoles are present.    Heart sounds: Normal heart sounds, S1 normal and S2 normal. Heart sounds not distant. No murmur. No friction rub. No gallop. No S3 or S4 sounds.   Pulmonary:     Effort: No tachypnea, bradypnea, accessory muscle usage, prolonged expiration, respiratory distress or retractions. He is not intubated.     Breath sounds: Normal breath sounds and air entry. No stridor, decreased air movement or transmitted upper airway sounds. No decreased breath sounds, wheezing, rhonchi or rales.  Abdominal:     General: Bowel sounds  are normal. There is no distension.     Palpations: Abdomen is soft.     Tenderness: There is no abdominal tenderness. There is no guarding or rebound.  Musculoskeletal: Normal range of motion.        General: No swelling or tenderness.     Right lower leg: No edema.     Left lower leg: No edema.  Lymphadenopathy:     Cervical: No cervical adenopathy.  Skin:    General: Skin is warm and dry.     Findings: No erythema or rash.  Neurological:     Mental Status: He is alert and oriented to person, place, and time. Mental status is at baseline.  Psychiatric:        Mood and Affect: Mood normal.        Behavior: Behavior normal.        Thought Content: Thought content normal.        Judgment: Judgment normal.    LABORATORY PANEL:   CBC Recent Labs  Lab 03/16/18 1936  WBC 11.0*  HGB 17.7*  HCT 52.6*  PLT  280   ------------------------------------------------------------------------------------------------------------------  Chemistries  Recent Labs  Lab 03/16/18 1936 03/16/18 2301  NA 138  --   K 3.9  --   CL 99  --   CO2 26  --   GLUCOSE 109*  --   BUN 17  --   CREATININE 1.29*  --   CALCIUM 10.0  --   MG  --  2.1  AST 18  --   ALT 14  --   ALKPHOS 62  --   BILITOT 0.9  --    ------------------------------------------------------------------------------------------------------------------  Cardiac Enzymes Recent Labs  Lab 03/16/18 2301  TROPONINI <0.03   ------------------------------------------------------------------------------------------------------------------  RADIOLOGY:  Dg Chest 2 View  Result Date: 03/16/2018 CLINICAL DATA:  38 year old male with shortness of breath and cough for 3 days. EXAM: CHEST - 2 VIEW COMPARISON:  03/08/2009 FINDINGS: LEFT LOWER lobe consolidation is compatible with pneumonia. The cardiomediastinal silhouette is unremarkable. No pleural effusion, pneumothorax or acute bony abnormality. IMPRESSION: LEFT LOWER lobe  consolidation likely represent pneumonia. Electronically Signed   By: Harmon Pier M.D.   On: 03/16/2018 20:26   Ct Angio Chest Pe W And/or Wo Contrast  Addendum Date: 03/16/2018   ADDENDUM REPORT: 03/16/2018 23:00 ADDENDUM: Please note additional imaging with better breath hold has been submitted. The described hypodensity in the left lower lobe pulmonary artery involving segmental and subsegmental branches appear real and represents a pulmonary embolus. The described triangular pleural-based density at the left lung base is most consistent with an area of infarct. These results were called by telephone at the time of interpretation on 03/16/2018 at 11:00 pm to Dr. Minna Antis , who verbally acknowledged these results. Electronically Signed   By: Elgie Collard M.D.   On: 03/16/2018 23:00   Result Date: 03/16/2018 CLINICAL DATA:  38 year old male with left-sided chest pain. EXAM: CT ANGIOGRAPHY CHEST WITH CONTRAST TECHNIQUE: Multidetector CT imaging of the chest was performed using the standard protocol during bolus administration of intravenous contrast. Multiplanar CT image reconstructions and MIPs were obtained to evaluate the vascular anatomy. CONTRAST:  75mL OMNIPAQUE IOHEXOL 350 MG/ML SOLN COMPARISON:  Chest radiograph dated 03/16/2018 FINDINGS: Evaluation of this exam is limited due to respiratory motion artifact. Cardiovascular: There is mild cardiomegaly with possible mild left ventricular hypertrophy. Correlation with echocardiogram recommended. No pericardial effusion. The thoracic aorta is unremarkable. Evaluation of the pulmonary arteries is very limited due to severe respiratory motion artifact and suboptimal visualization of the peripheral branches. An apparent small filling defect in the left lower lobe subsegmental branch (series 5 image 154-167) may be artifactual or represent a small pulmonary embolus. A V/Q scan may provide better evaluation if there is high clinical concern for  acute PE. There is no CT evidence of right heart straining. Mediastinum/Nodes: No hilar or mediastinal adenopathy. The esophagus is grossly unremarkable as visualized. No mediastinal fluid collection. Lungs/Pleura: There is an area of consolidative change in the left lower lobe which may represent pneumonia although an area of infarct related to pulmonary embolism is not entirely excluded. Clinical correlation is recommended. Right lung base linear atelectasis versus infiltrate noted. There is a small left pleural effusion. No pneumothorax. The central airways are patent. Upper Abdomen: No acute abnormality. Musculoskeletal: No chest wall abnormality. No acute or significant osseous findings. Review of the MIP images confirms the above findings. IMPRESSION: 1. Very limited, almost nondiagnostic study for evaluation of pulmonary embolism due to severe respiratory motion artifact. An apparent filling defect within a left lower lobe subsegmental  pulmonary artery branch may be artifactual or represent a pulmonary embolus. Clinical correlation is recommended. V/Q scan may provide better evaluation if there is high clinical concern for acute PE. 2. Left lower lobe consolidation which may represent pneumonia although pulmonary infarct is not excluded. 3. Small left pleural effusion. 4. Mild cardiomegaly with findings of left ventricular hypertrophy. Correlation with echocardiogram and EKG recommended. These results were called by telephone at the time of interpretation on 03/16/2018 at 10:16 pm to Dr. Minna Antis , who verbally acknowledged these results. Electronically Signed: By: Elgie Collard M.D. On: 03/16/2018 22:25   IMPRESSION AND PLAN:   A/P: 64M w/ PMHx HTN, PE (off systemic AC) p/w L lower CP, L lower back pain, SOB. High suspicion for PE (vs. possible pneumonia). Unctrl HTN. Hyperglycemia, Cr elevation, Troponin elevation, polycythemia. -L lower CP, L lower back pain, SOB, suspected PE, possible  pneumonia, SIRS (+), Troponin elevation: Hx PE. Long-distance travel. Pleuritic CP/back pain, SOB. No longer on systemic AC. Trop-I 0.05. CTA chest report reads, "Very limited, almost nondiagnostic study for evaluation of pulmonary embolism due to severe respiratory motion artifact. An apparent filling defect within a left lower lobe subsegmental pulmonary artery branch may be artifactual or represent a pulmonary embolus. Clinical correlation is recommended. V/Q scan may provide better evaluation if there is high clinical concern for acute PE. Left lower lobe consolidation which may represent pneumonia although pulmonary infarct is not excluded." V/Q scan pending. High suspicion for PE. Therapeutic weight-based Lovenox 1mg /kg SQ BID for now. Transition to NoAC if/when PE confirmed. ASA, Morphine, trend Trop-I, NTG, O2, cardiac monitoring, pulse oximetry, Echo. Meets SIRS criteria (HR > 90, tachypnea), CT w/ infiltrate; technically meets criteria for CAP + sepsis, though suspicion for infxn is lower. Flu (-). Sputum Cx/gram, UStrep + ULeg Ag, PCT pending. Ceftriaxone + Azithromycin for now, though this can likely be de-escalated in short order. IVF, pain ctrl. -Unctrl HTN: Norvasc. -Cr elevation: Cr 1.29. Baseline 0.9-1.1. (-) AKI. IVF. Monitor BMP, avoid nephrotoxins. -Polycythemia: Volume contraction. IVF. -No Rx home meds. -FEN/GI: Cardiac diet. -DVT PPx: Therapeutic Lovenox. -Code status: Full code. -Disposition: Admission, > 2 midnights.   All the records are reviewed and case discussed with ED provider. Management plans discussed with the patient, family and they are in agreement.  CODE STATUS: Full code.  TOTAL TIME TAKING CARE OF THIS PATIENT: 75 minutes.    Barbaraann Rondo M.D on 03/17/2018 at 12:06 AM  Between 7am to 6pm - Pager - [phone]  After 6pm go to [email or web address] - Social research officer, government  [facility] [place] [facility]  Office  [phone]  CC: Primary care  physician; System, Pcp Not In   Note: This dictation was prepared with Dragon dictation along with smaller phrase technology. Any transcriptional errors that result from this process are unintentional.

## 2018-03-18 LAB — LEGIONELLA PNEUMOPHILA SEROGP 1 UR AG: L. pneumophila Serogp 1 Ur Ag: NEGATIVE

## 2018-03-19 LAB — HIV ANTIBODY (ROUTINE TESTING W REFLEX): HIV Screen 4th Generation wRfx: NONREACTIVE

## 2018-05-13 ENCOUNTER — Telehealth: Payer: Self-pay

## 2018-05-13 NOTE — Telephone Encounter (Signed)
CSW received a phone call from patient, who stated he can not afford his medication and it will cost him $700.  CSW informed him that he would have to follow up with his PCP or cardiologist.  Patient stated he does not have insurance, CSW suggested he try to sign up with [facility].  Patient stated he does not want to return to the hospital, CSW informed him that unfortunately CSW can not help him right now since he is not admitted.  Ervin Knack. Ayodele Hartsock, MSW, LCSW [phone]  05/13/2018 1:51 PM

## 2018-05-14 NOTE — Telephone Encounter (Signed)
Called patient on 4.27 at 3:30pm as patient had called and spoke with CSW about Eliquis coupon.  Asked if patient had ever used coupon before; he stated he had not.  Patient did not keep follow up appointment with Dr. Mariah Milling.  Patient was discharged in early March and has not been taking medication.  He states he has the prescription at his [facility] pharmacy.  Asked if patient could come to [facility] and pick up Eliquis 30 day free coupon.  He stated he would be there in 5 minutes.  Minerva Areola, CSW walked coupon down to staff at [facility] entrance to give to patient when he arrives.  Asked patient to call this RNCM back if he had any problems obtaining medication.  Encouraged patient to call Dr. Windell Hummingbird office to reschedule follow up appointment.

## 2019-07-15 DIAGNOSIS — I82402 Acute embolism and thrombosis of unspecified deep veins of left lower extremity: Secondary | ICD-10-CM | POA: Diagnosis present

## 2019-07-15 DIAGNOSIS — D6859 Other primary thrombophilia: Secondary | ICD-10-CM | POA: Insufficient documentation

## 2021-01-19 ENCOUNTER — Inpatient Hospital Stay (HOSPITAL_COMMUNITY)
Admission: EM | Admit: 2021-01-19 | Discharge: 2021-01-21 | DRG: 176 | Disposition: A | Discharge status: Home / Self Care | Payer: Self-pay | Attending: Family Medicine | Admitting: Family Medicine

## 2021-01-19 ENCOUNTER — Encounter (HOSPITAL_COMMUNITY): Payer: Self-pay

## 2021-01-19 ENCOUNTER — Other Ambulatory Visit: Payer: Self-pay

## 2021-01-19 ENCOUNTER — Emergency Department (HOSPITAL_COMMUNITY): Payer: Self-pay

## 2021-01-19 DIAGNOSIS — I82402 Acute embolism and thrombosis of unspecified deep veins of left lower extremity: Secondary | ICD-10-CM | POA: Diagnosis present

## 2021-01-19 DIAGNOSIS — Z86711 Personal history of pulmonary embolism: Secondary | ICD-10-CM

## 2021-01-19 DIAGNOSIS — I824Z2 Acute embolism and thrombosis of unspecified deep veins of left distal lower extremity: Secondary | ICD-10-CM

## 2021-01-19 DIAGNOSIS — Z20822 Contact with and (suspected) exposure to covid-19: Secondary | ICD-10-CM | POA: Diagnosis present

## 2021-01-19 DIAGNOSIS — D6859 Other primary thrombophilia: Secondary | ICD-10-CM | POA: Diagnosis present

## 2021-01-19 DIAGNOSIS — I82412 Acute embolism and thrombosis of left femoral vein: Secondary | ICD-10-CM | POA: Diagnosis present

## 2021-01-19 DIAGNOSIS — I82442 Acute embolism and thrombosis of left tibial vein: Secondary | ICD-10-CM | POA: Diagnosis present

## 2021-01-19 DIAGNOSIS — I10 Essential (primary) hypertension: Secondary | ICD-10-CM | POA: Diagnosis present

## 2021-01-19 DIAGNOSIS — I2699 Other pulmonary embolism without acute cor pulmonale: Principal | ICD-10-CM

## 2021-01-19 DIAGNOSIS — Z8616 Personal history of COVID-19: Secondary | ICD-10-CM

## 2021-01-19 DIAGNOSIS — Z91018 Allergy to other foods: Secondary | ICD-10-CM

## 2021-01-19 DIAGNOSIS — I2693 Single subsegmental pulmonary embolism without acute cor pulmonale: Principal | ICD-10-CM | POA: Diagnosis present

## 2021-01-19 DIAGNOSIS — Z86718 Personal history of other venous thrombosis and embolism: Secondary | ICD-10-CM

## 2021-01-19 DIAGNOSIS — Z888 Allergy status to other drugs, medicaments and biological substances status: Secondary | ICD-10-CM

## 2021-01-19 LAB — CBC
HCT: 41.9 % (ref 39.0–52.0)
Hemoglobin: 13.7 g/dL (ref 13.0–17.0)
MCH: 28.9 pg (ref 26.0–34.0)
MCHC: 32.7 g/dL (ref 30.0–36.0)
MCV: 88.4 fL (ref 80.0–100.0)
Platelets: 265 10*3/uL (ref 150–400)
RBC: 4.74 MIL/uL (ref 4.22–5.81)
RDW: 13.5 % (ref 11.5–15.5)
WBC: 4.8 10*3/uL (ref 4.0–10.5)
nRBC: 0 % (ref 0.0–0.2)

## 2021-01-19 LAB — BASIC METABOLIC PANEL
Anion gap: 11 (ref 5–15)
BUN: 14 mg/dL (ref 6–20)
CO2: 23 mmol/L (ref 22–32)
Calcium: 9.1 mg/dL (ref 8.9–10.3)
Chloride: 104 mmol/L (ref 98–111)
Creatinine, Ser: 1.21 mg/dL (ref 0.61–1.24)
GFR, Estimated: >60 mL/min (ref >60)
Glucose, Bld: 103 mg/dL — ABNORMAL HIGH (ref 70–99)
Potassium: 4.7 mmol/L (ref 3.5–5.1)
Sodium: 138 mmol/L (ref 135–145)

## 2021-01-19 LAB — RESP PANEL BY RT-PCR (FLU A&B, COVID) ARPGX2
Influenza A by PCR: NEGATIVE
Influenza B by PCR: NEGATIVE
SARS Coronavirus 2 by RT PCR: POSITIVE — AB

## 2021-01-19 LAB — TROPONIN I (HIGH SENSITIVITY)
Troponin I (High Sensitivity): 18 ng/L — ABNORMAL HIGH (ref <18)
Troponin I (High Sensitivity): 19 ng/L — ABNORMAL HIGH (ref <18)

## 2021-01-19 MED ORDER — ENOXAPARIN SODIUM 120 MG/0.8ML IJ SOSY
1.0000 mg/kg | PREFILLED_SYRINGE | Freq: Two times a day (BID) | INTRAMUSCULAR | Status: DC
  Administered 2021-01-20 – 2021-01-21 (×3): 117 mg via SUBCUTANEOUS
  Filled 2021-01-19 (×3): qty 0.8

## 2021-01-19 MED ORDER — ENOXAPARIN SODIUM 120 MG/0.8ML IJ SOSY
1.0000 mg/kg | PREFILLED_SYRINGE | Freq: Once | INTRAMUSCULAR | Status: AC
  Administered 2021-01-19: 117 mg via SUBCUTANEOUS
  Filled 2021-01-19: qty 0.8

## 2021-01-19 MED ORDER — IOHEXOL 350 MG/ML SOLN
100.0000 mL | Freq: Once | INTRAVENOUS | Status: AC | PRN
  Administered 2021-01-19: 100 mL via INTRAVENOUS

## 2021-01-19 NOTE — Progress Notes (Signed)
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 All nuts   Lac Bovis Diarrhea    Patient Measurements: Height: 6\' 4"  (193 cm) Weight: 115.7 kg (255 lb) IBW/kg (Calculated) : 86.8  Vital Signs: Temp: 98.3 F (36.8 C) (01/04 1535) BP: 131/93 (01/04 2300) Pulse Rate: 109 (01/04 2300)  Labs: Recent Labs    01/19/21 1615 01/19/21 1827  HGB 13.7  --   HCT 41.9  --   PLT 265  --   CREATININE 1.21  --   TROPONINIHS 18* 19*    Estimated Creatinine Clearance: 115.2 mL/min (by C-G formula based on SCr of 1.21 mg/dL).   Medical History: Past Medical History:  Diagnosis Date   Hypertension    Pulmonary embolism (HCC)     Assessment: 41yo male c/o cough/SOB x3wk, h/o VTE and currently taking Eliquis (though there are no records of prescription being filled in Epic Rx hx tool), found to have PE in all lobes, to begin Lovenox.  Goal of Therapy:  Anti-Xa level 0.6-1 units/ml 4hrs after LMWH dose given Monitor platelets by anticoagulation protocol: Yes   Plan:  Lovenox 1 mg/kg SQ Q12H.  41yo, PharmD, BCPS  01/19/2021,11:41 PM

## 2021-01-19 NOTE — ED Triage Notes (Signed)
Pt arrives with c/o exertional SOB that started about a month ago. Pt denies fevers. Pt endorses generalized body aches and cough.

## 2021-01-19 NOTE — ED Provider Notes (Signed)
[facility] EMERGENCY DEPARTMENT Provider Note   CSN: [id number] Arrival date & time: 01/19/21  1506     History History of DVT PE and hypercoagulability Chief Complaint  Patient presents with   Shortness of Breath    Keith Norman is a 41 y.o. male with history of 3 weeks of cough and shortness of breath.  History of DVT 3 years ago.  He states he is taking Eliquis consistently.  Feels that his pain is due to coughing.  He has associated rhinorrhea. Hx of PE/ DVT, unprovoked. Patient sees Hem/Onc and had a recent visit. Patient used to take Xarelto and failed tx- switched to Eliquis 2 years ago.   Shortness of Breath Associated symptoms: chest pain and cough       Home Medications Prior to Admission medications   Medication Sig Start Date End Date Taking? Authorizing Provider  apixaban (ELIQUIS) 5 MG TABS tablet Take 5 mg by mouth 2 (two) times daily.   Yes [provider]  amLODipine (NORVASC) 5 MG tablet Take 1 tablet (5 mg total) by mouth daily. Patient not taking: Reported on 01/20/2021 03/18/18   Vaughan Basta, MD  Arne Cleveland DVT/PE STARTER PACK (ELIQUIS STARTER PACK) 5 MG TABS Take as directed on package: start with two-5mg  tablets twice daily for 7 days. On day 8, switch to one-5mg  tablet twice daily. Patient not taking: Reported on 01/20/2021 03/17/18   Vaughan Basta, MD      Allergies    Black walnut pollen allergy skin test, Other, and Lac bovis    Review of Systems   Review of Systems  Respiratory:  Positive for cough and shortness of breath.   Cardiovascular:  Positive for chest pain.   Physical Exam Updated Vital Signs BP (!) 118/92    Pulse 95    Temp 98.4 F (36.9 C) (Oral)    Resp (!) 28    Ht 6\' 4"  (1.93 m)    Wt 115.7 kg    SpO2 97%    BMI 31.04 kg/m  Physical Exam Vitals and nursing note reviewed.  Constitutional:      General: He is not in acute distress.    Appearance: He is well-developed. He is not diaphoretic.  HENT:      Head: Normocephalic and atraumatic.  Eyes:     General: No scleral icterus.    Conjunctiva/sclera: Conjunctivae normal.  Cardiovascular:     Rate and Rhythm: Regular rhythm. Tachycardia present.     Heart sounds: Normal heart sounds.  Pulmonary:     Effort: Pulmonary effort is normal. Tachypnea present. No respiratory distress.     Breath sounds: Normal breath sounds.  Abdominal:     Palpations: Abdomen is soft.     Tenderness: There is no abdominal tenderness.  Musculoskeletal:     Cervical back: Normal range of motion and neck supple.     Right lower leg: No edema.     Left lower leg: No edema.  Skin:    General: Skin is warm and dry.  Neurological:     Mental Status: He is alert.  Psychiatric:        Behavior: Behavior normal.    ED Results / Procedures / Treatments   Labs (all labs ordered are listed, but only abnormal results are displayed) Labs Reviewed  RESP PANEL BY RT-PCR (FLU A&B, COVID) ARPGX2 - Abnormal; Notable for the following components:      Result Value   SARS Coronavirus 2 by RT PCR POSITIVE (*)  All other components within normal limits  BASIC METABOLIC PANEL - Abnormal; Notable for the following components:   Glucose, Bld 103 (*)    All other components within normal limits  COMPREHENSIVE METABOLIC PANEL - Abnormal; Notable for the following components:   Sodium 133 (*)    Creatinine, Ser 1.26 (*)    Calcium 8.7 (*)    All other components within normal limits  TROPONIN I (HIGH SENSITIVITY) - Abnormal; Notable for the following components:   Troponin I (High Sensitivity) 18 (*)    All other components within normal limits  TROPONIN I (HIGH SENSITIVITY) - Abnormal; Notable for the following components:   Troponin I (High Sensitivity) 19 (*)    All other components within normal limits  CBC  CBC  MAGNESIUM  CBC WITH DIFFERENTIAL/PLATELET  HIV ANTIBODY (ROUTINE TESTING W REFLEX)    EKG EKG Interpretation  Date/Time:  Wednesday January 19 2021 15:39:37 EST Ventricular Rate:  103 PR Interval:  184 QRS Duration: 98 QT Interval:  368 QTC Calculation: 482 R Axis:   -43 Text Interpretation: Sinus tachycardia Left axis deviation Minimal voltage criteria for LVH, may be normal variant ( Cornell product ) Inferior infarct , age undetermined Anterior infarct , age undetermined Abnormal ECG When compared with ECG of 16-Mar-2018 19:31, No significant change was found Confirmed by Delora Fuel ([id number]) on 01/20/2021 4:42:52 AM  Radiology DG Chest 2 View  Result Date: 01/19/2021 CLINICAL DATA:  Shortness of breath, cough. EXAM: CHEST - 2 VIEW COMPARISON:  March 16, 2018. FINDINGS: The heart size and mediastinal contours are within normal limits. Both lungs are clear. The visualized skeletal structures are unremarkable. IMPRESSION: No active cardiopulmonary disease. Electronically Signed   By: Marijo Conception M.D.   On: 01/19/2021 16:01   CT Angio Chest PE W and/or Wo Contrast  Result Date: 01/19/2021 CLINICAL DATA:  Pulmonary embolism (PE) suspected, high prob. Pt arrives with c/o exertional SOB that started about a month ago. Pt denies fevers. Pt endorses generalized body aches and cough. history of PE. COVID-19 infection positive. EXAM: CT ANGIOGRAPHY CHEST WITH CONTRAST TECHNIQUE: Multidetector CT imaging of the chest was performed using the standard protocol during bolus administration of intravenous contrast. Multiplanar CT image reconstructions and MIPs were obtained to evaluate the vascular anatomy. CONTRAST:  191mL OMNIPAQUE IOHEXOL 350 MG/ML SOLN COMPARISON:  CT angiography chest 03/16/2018 FINDINGS: Cardiovascular: Satisfactory opacification of the pulmonary arteries to the segmental level. No evidence of pulmonary embolism. Normal heart size. No significant pericardial effusion. The thoracic aorta is normal in caliber. No atherosclerotic plaque of the thoracic aorta. No definite coronary artery calcifications. Mediastinum/Nodes: No  enlarged mediastinal, hilar, or axillary lymph nodes. Thyroid gland, trachea, and esophagus demonstrate no significant findings. Lungs/Pleura: Right apical paraseptal emphysematous changes. Right apical pleural/pulmonary scarring. Cannot excluded developing pulmonary infarction within the lingula (6:70). There is a 2.2 x 1.4 cm ground-glass peripheral left upper lobe airspace opacity. No pulmonary mass. No pleural effusion. No pneumothorax. Upper Abdomen: No acute abnormality. Musculoskeletal: No chest wall abnormality. No suspicious lytic or blastic osseous lesions. No acute displaced fracture. Multilevel degenerative changes of the spine. Review of the MIP images confirms the above findings. IMPRESSION: 1. Distal central left pulmonary embolus as well as all five pulmonary lobes segmental and subsegmental pulmonary emboli. No associated right heart strain. Cannot excluded developing pulmonary infarction within the lingula. 2. Indeterminate 2.2 x 1.4 cm ground-glass peripheral left upper lobe airspace opacity. Recommend short-term repeat CT in 3 months once  COVID-19 infection is treated to evaluate for stability. Non-contrast chest CT at 3-6 months is recommended. If nodules persist, subsequent management will be based upon the most suspicious nodule(s). This recommendation follows the consensus statement: Guidelines for Management of Incidental Pulmonary Nodules Detected on CT Images: From the Fleischner Society 2017; Radiology 2017; 284:228-243. 3.  Emphysema (ICD10-J43.9). These results were called by telephone at the time of interpretation on 01/19/2021 at 9:30 pm to provider Keaun Schnabel , who verbally acknowledged these results. Electronically Signed   By: Iven Finn M.D.   On: 01/19/2021 21:36   US Venous Img Lower Bilateral (DVT)  Result Date: 01/20/2021 CLINICAL DATA:  Recent diagnosis of pulmonary embolism. Evaluate for DVT. EXAM: BILATERAL LOWER EXTREMITY VENOUS DOPPLER ULTRASOUND TECHNIQUE:  Gray-scale sonography with graded compression, as well as color Doppler and duplex ultrasound were performed to evaluate the lower extremity deep venous systems from the level of the common femoral vein and including the common femoral, femoral, profunda femoral, popliteal and calf veins including the posterior tibial, peroneal and gastrocnemius veins when visible. The superficial great saphenous vein was also interrogated. Spectral Doppler was utilized to evaluate flow at rest and with distal augmentation maneuvers in the common femoral, femoral and popliteal veins. COMPARISON:  None. FINDINGS: RIGHT LOWER EXTREMITY Common Femoral Vein: No evidence of thrombus. Normal compressibility, respiratory phasicity and response to augmentation. Saphenofemoral Junction: No evidence of thrombus. Normal compressibility and flow on color Doppler imaging. Profunda Femoral Vein: No evidence of thrombus. Normal compressibility and flow on color Doppler imaging. There is mixed echogenic occlusive thrombus involving the proximal (image 10), mid (image 17) and distal (image 21) aspects of the right femoral vein, extending to involve the right popliteal vein (image 23) as well as the imaged portions of the right posterior tibial and peroneal veins (image 38). Superficial Great Saphenous Vein: No evidence of thrombus. Normal compressibility. Other Findings: There is hypoechoic occlusive thrombus involving the right lesser saphenous vein (image 27). LEFT LOWER EXTREMITY There is hypoechoic occlusive thrombus involving the left common femoral vein (image 48), extending to involve the saphenofemoral junction (image 50) as well as the imaged portions of the left deep femoral vein (image 54). There is hypoechoic occlusive thrombus involving the proximal (image 55), mid (image 59) and distal (image 63) aspects of the femoral vein, extending to involve the left popliteal vein (image 67) as well as the imaged aspects of the left tibial veins.  Superficial Great Saphenous Vein: No evidence of thrombus. Normal compressibility. Other Findings: There is near occlusive thrombus involving the left lesser saphenous vein (image 71). IMPRESSION: 1. The examination is positive for extensive predominantly occlusive DVT extending from the proximal aspect of the right femoral vein through the imaged right tibial veins. 2. Examination is positive for extensive predominantly occlusive DVT extending from the left common femoral vein through the imaged left tibial veins. 3. Examination is positive for occlusive/near occlusive superficial thrombophlebitis involving the bilateral lesser saphenous veins. Electronically Signed   By: Sandi Mariscal M.D.   On: 01/20/2021 10:10    Procedures Procedures  Cardiac monitoring reveals sinus tachycardia at a rate of 114 (Rate & rhythm), as reviewed and interpreted by me. Cardiac monitoring was ordered due to persistent tachycardia- PEs and to monitor patient for dysrhythmia.   Medications Ordered in ED Medications  enoxaparin (LOVENOX) injection 117 mg (117 mg Subcutaneous Given 01/20/21 0935)  morphine 4 MG/ML injection 4 mg (4 mg Intravenous Given 01/20/21 0449)  HYDROcodone bit-homatropine (HYCODAN) 5-1.5 MG/5ML  syrup 5 mL (5 mLs Oral Given 01/20/21 0935)  amLODipine (NORVASC) tablet 5 mg (5 mg Oral Given 01/20/21 0935)  acetaminophen (TYLENOL) tablet 650 mg (has no administration in time range)    Or  acetaminophen (TYLENOL) suppository 650 mg (has no administration in time range)  oxyCODONE (Oxy IR/ROXICODONE) immediate release tablet 5 mg (5 mg Oral Given 01/20/21 0935)  ondansetron (ZOFRAN) tablet 4 mg (has no administration in time range)    Or  ondansetron (ZOFRAN) injection 4 mg (has no administration in time range)  iohexol (OMNIPAQUE) 350 MG/ML injection 100 mL (100 mLs Intravenous Contrast Given 01/19/21 2113)  enoxaparin (LOVENOX) injection 117 mg (117 mg Subcutaneous Given 01/19/21 2231)  acetaminophen (TYLENOL)  tablet 650 mg (650 mg Oral Given 01/20/21 0247)    ED Course/ Medical Decision Making/ A&P                           Medical Decision Making  This patient presents to the ED for concern of cough and chest pain , this involves an extensive number of treatment options, and is a complaint that carries with it a high risk of complications and morbidity.  The differential diagnosis includes URI, PE, ACS, myo/pericarditis, pneumonia, pneumothorax    Co morbidities that complicate the patient evaluation  hx of hypercoagulable state, previous PE- Failed Xarelto- long term use of Eliquis in compliance   Additional history obtained:  Additional history obtained from EMR External records from outside source obtained and reviewed including previous hospitalization at [facility] for PE   Lab Tests:   I Ordered, and personally interpreted labs.  The pertinent results include:    Resp pnl + for Covid 19 BMP- unremarkable Troponin pos x 2 CBC unremarkable   Imaging Studies ordered:  I ordered imaging studies including 2 v cxr and CT Angio I independently visualized and interpreted imaging which showed CXR- shows no acute findings CTA- multiple central- segmental and subsegmental PEs ground glass opacity RUL I agree with the radiologist interpretation   Cardiac Monitoring:  The patient was maintained on a cardiac monitor.  I personally viewed and interpreted the cardiac monitored which showed an underlying rhythm of: sinus tachycardia 114   I independently interpreted an EKG which showed sinus tachycardia at a rate of 103  Medicines ordered and prescription drug management: LOVENOX 1mg /kg    RISK: patient is High risk for death in the next 30 days on Simplified PESI score    Consultations Obtained:  Discussed radiologic findings with Dr, Mckinley Jewel via phone call I requested consultation with the PCCM (Dr. Valeta Harms) and discussed medications recommendations, appropriate inpatient unit and  hospital choice, lab and imaging findings as well as pertinent plan - they recommend: recommends admission at [facility] with lovenox  Discussed admission with Dr. Carlynn Purl who will admit   Problem List / ED Course:  SOB/ Cough/Cp- +covid 19/ multiple PE's without right heart strain   Reevaluation:  After the interventions noted above, I reevaluated the patient and found that they have :stayed the same   Social Determinants of Health:  Patient is uninsured   Dispostion:  After consideration of the diagnostic results and the patients response to treatment, I feel that the patent would benefit from admission.  Final Clinical Impression(s) / ED Diagnoses Final diagnoses:  Acute pulmonary embolism ([place])    Rx / DC Orders ED Discharge Orders     None  Margarita Mail, PA-C 01/20/21 1040    Milton Ferguson, MD 01/21/21 1144

## 2021-01-20 ENCOUNTER — Inpatient Hospital Stay (HOSPITAL_COMMUNITY): Payer: Self-pay

## 2021-01-20 DIAGNOSIS — R0609 Other forms of dyspnea: Secondary | ICD-10-CM

## 2021-01-20 DIAGNOSIS — I2699 Other pulmonary embolism without acute cor pulmonale: Secondary | ICD-10-CM

## 2021-01-20 LAB — CBC WITH DIFFERENTIAL/PLATELET
Abs Immature Granulocytes: 0.03 10*3/uL (ref 0.00–0.07)
Basophils Absolute: 0 10*3/uL (ref 0.0–0.1)
Basophils Relative: 1 %
Eosinophils Absolute: 0 10*3/uL (ref 0.0–0.5)
Eosinophils Relative: 0 %
HCT: 40.5 % (ref 39.0–52.0)
Hemoglobin: 13.4 g/dL (ref 13.0–17.0)
Immature Granulocytes: 1 %
Lymphocytes Relative: 26 %
Lymphs Abs: 1.4 10*3/uL (ref 0.7–4.0)
MCH: 29.1 pg (ref 26.0–34.0)
MCHC: 33.1 g/dL (ref 30.0–36.0)
MCV: 88 fL (ref 80.0–100.0)
Monocytes Absolute: 0.6 10*3/uL (ref 0.1–1.0)
Monocytes Relative: 11 %
Neutro Abs: 3.3 10*3/uL (ref 1.7–7.7)
Neutrophils Relative %: 61 %
Platelets: 258 10*3/uL (ref 150–400)
RBC: 4.6 MIL/uL (ref 4.22–5.81)
RDW: 13.8 % (ref 11.5–15.5)
WBC: 5.4 10*3/uL (ref 4.0–10.5)
nRBC: 0 % (ref 0.0–0.2)

## 2021-01-20 LAB — HIV ANTIBODY (ROUTINE TESTING W REFLEX): HIV Screen 4th Generation wRfx: NONREACTIVE

## 2021-01-20 LAB — ECHOCARDIOGRAM COMPLETE
AR max vel: 2.11 cm2
AV Area VTI: 2.01 cm2
AV Area mean vel: 1.88 cm2
AV Mean grad: 4 mmHg
AV Peak grad: 7.5 mmHg
Ao pk vel: 1.37 m/s
Area-P 1/2: 4.65 cm2
Calc EF: 44.9 %
Height: 76 in
MV VTI: 2.86 cm2
P 1/2 time: 670 msec
S' Lateral: 3.1 cm
Single Plane A2C EF: 54 %
Single Plane A4C EF: 38.5 %
Weight: 4080 oz

## 2021-01-20 LAB — CBC
HCT: 40.6 % (ref 39.0–52.0)
Hemoglobin: 13.5 g/dL (ref 13.0–17.0)
MCH: 28.8 pg (ref 26.0–34.0)
MCHC: 33.3 g/dL (ref 30.0–36.0)
MCV: 86.6 fL (ref 80.0–100.0)
Platelets: 259 10*3/uL (ref 150–400)
RBC: 4.69 MIL/uL (ref 4.22–5.81)
RDW: 13.6 % (ref 11.5–15.5)
WBC: 5.8 10*3/uL (ref 4.0–10.5)
nRBC: 0 % (ref 0.0–0.2)

## 2021-01-20 LAB — COMPREHENSIVE METABOLIC PANEL
ALT: 30 U/L (ref 0–44)
AST: 23 U/L (ref 15–41)
Albumin: 4 g/dL (ref 3.5–5.0)
Alkaline Phosphatase: 74 U/L (ref 38–126)
Anion gap: 11 (ref 5–15)
BUN: 13 mg/dL (ref 6–20)
CO2: 22 mmol/L (ref 22–32)
Calcium: 8.7 mg/dL — ABNORMAL LOW (ref 8.9–10.3)
Chloride: 100 mmol/L (ref 98–111)
Creatinine, Ser: 1.26 mg/dL — ABNORMAL HIGH (ref 0.61–1.24)
GFR, Estimated: >60 mL/min (ref >60)
Glucose, Bld: 88 mg/dL (ref 70–99)
Potassium: 4 mmol/L (ref 3.5–5.1)
Sodium: 133 mmol/L — ABNORMAL LOW (ref 135–145)
Total Bilirubin: 0.8 mg/dL (ref 0.3–1.2)
Total Protein: 7.9 g/dL (ref 6.5–8.1)

## 2021-01-20 LAB — MAGNESIUM: Magnesium: 1.8 mg/dL (ref 1.7–2.4)

## 2021-01-20 MED ORDER — OXYCODONE HCL 5 MG PO TABS
5.0000 mg | ORAL_TABLET | ORAL | Status: DC | PRN
  Administered 2021-01-20 – 2021-01-21 (×3): 5 mg via ORAL
  Filled 2021-01-20 (×3): qty 1

## 2021-01-20 MED ORDER — ACETAMINOPHEN 325 MG PO TABS
650.0000 mg | ORAL_TABLET | Freq: Once | ORAL | Status: AC | PRN
  Administered 2021-01-20: 650 mg via ORAL
  Filled 2021-01-20: qty 2

## 2021-01-20 MED ORDER — HYDROCODONE BIT-HOMATROP MBR 5-1.5 MG/5ML PO SOLN
5.0000 mL | Freq: Four times a day (QID) | ORAL | Status: DC | PRN
  Administered 2021-01-20 – 2021-01-21 (×4): 5 mL via ORAL
  Filled 2021-01-20 (×4): qty 5

## 2021-01-20 MED ORDER — ONDANSETRON HCL 4 MG/2ML IJ SOLN: 4.0000 mg | Freq: Four times a day (QID) | INTRAMUSCULAR | Status: DC | PRN

## 2021-01-20 MED ORDER — ONDANSETRON HCL 4 MG PO TABS: 4.0000 mg | ORAL_TABLET | Freq: Four times a day (QID) | ORAL | Status: DC | PRN

## 2021-01-20 MED ORDER — LACTATED RINGERS IV SOLN
INTRAVENOUS | Status: AC
  Administered 2021-01-20: 1000 mL via INTRAVENOUS

## 2021-01-20 MED ORDER — ACETAMINOPHEN 325 MG PO TABS: 650.0000 mg | ORAL_TABLET | Freq: Four times a day (QID) | ORAL | Status: DC | PRN

## 2021-01-20 MED ORDER — AMLODIPINE BESYLATE 5 MG PO TABS
5.0000 mg | ORAL_TABLET | Freq: Every day | ORAL | Status: DC
Start: 2021-01-20 — End: 2021-01-21
  Administered 2021-01-20 – 2021-01-21 (×2): 5 mg via ORAL
  Filled 2021-01-20 (×2): qty 1

## 2021-01-20 MED ORDER — ACETAMINOPHEN 650 MG RE SUPP: 650.0000 mg | Freq: Four times a day (QID) | RECTAL | Status: DC | PRN

## 2021-01-20 MED ORDER — MORPHINE SULFATE (PF) 2 MG/ML IV SOLN
2.0000 mg | INTRAVENOUS | Status: DC | PRN
  Administered 2021-01-20 – 2021-01-21 (×2): 2 mg via INTRAVENOUS
  Filled 2021-01-20 (×2): qty 1

## 2021-01-20 MED ORDER — MORPHINE SULFATE (PF) 4 MG/ML IV SOLN
4.0000 mg | INTRAVENOUS | Status: DC | PRN
  Administered 2021-01-20 (×2): 4 mg via INTRAVENOUS
  Filled 2021-01-20 (×2): qty 1

## 2021-01-20 NOTE — ED Notes (Signed)
Attempted to call report to nurse.  No answer.

## 2021-01-20 NOTE — Progress Notes (Incomplete)
*  PRELIMINARY RESULTS* Echocardiogram 2D Echocardiogram has been performed.  Carolyne Fiscal 01/20/2021, 10:52 AM

## 2021-01-20 NOTE — Progress Notes (Signed)
ASSUMPTION OF CARE NOTE   01/20/2021 4:29 PM  Keith Norman was seen and examined.  The H&P by the admitting provider, orders, imaging was reviewed.  Please see new orders.  Pt insists that he has been taking his apixaban and has received multiple bottles of the medication and not missed any doses.  He has a lot of clot burden in legs and now PE.  Given the failed apixaban would ask for hematology opinion.  Continue enoxaparin injections for now.    Vitals:   01/20/21 1151 01/20/21 1300  BP: (!) 141/95 138/88  Pulse: 93 99  Resp: (!) 22 20  Temp: 98.2 F (36.8 C) 99 F (37.2 C)  SpO2: 93%     Results for orders placed or performed during the hospital encounter of 01/19/21  Resp Panel by RT-PCR (Flu A&B, Covid) Nasopharyngeal Swab   Specimen: Nasopharyngeal Swab; Nasopharyngeal(NP) swabs in vial transport medium  Result Value Ref Range   SARS Coronavirus 2 by RT PCR POSITIVE (A) NEGATIVE   Influenza A by PCR NEGATIVE NEGATIVE   Influenza B by PCR NEGATIVE NEGATIVE  Basic metabolic panel  Result Value Ref Range   Sodium 138 135 - 145 mmol/L   Potassium 4.7 3.5 - 5.1 mmol/L   Chloride 104 98 - 111 mmol/L   CO2 23 22 - 32 mmol/L   Glucose, Bld 103 (H) 70 - 99 mg/dL   BUN 14 6 - 20 mg/dL   Creatinine, Ser 1.21 0.61 - 1.24 mg/dL   Calcium 9.1 8.9 - 10.3 mg/dL   GFR, Estimated >60 >60 mL/min   Anion gap 11 5 - 15  CBC  Result Value Ref Range   WBC 4.8 4.0 - 10.5 K/uL   RBC 4.74 4.22 - 5.81 MIL/uL   Hemoglobin 13.7 13.0 - 17.0 g/dL   HCT 41.9 39.0 - 52.0 %   MCV 88.4 80.0 - 100.0 fL   MCH 28.9 26.0 - 34.0 pg   MCHC 32.7 30.0 - 36.0 g/dL   RDW 13.5 11.5 - 15.5 %   Platelets 265 150 - 400 K/uL   nRBC 0.0 0.0 - 0.2 %  CBC  Result Value Ref Range   WBC 5.8 4.0 - 10.5 K/uL   RBC 4.69 4.22 - 5.81 MIL/uL   Hemoglobin 13.5 13.0 - 17.0 g/dL   HCT 40.6 39.0 - 52.0 %   MCV 86.6 80.0 - 100.0 fL   MCH 28.8 26.0 - 34.0 pg   MCHC 33.3 30.0 - 36.0 g/dL   RDW 13.6 11.5 - 15.5 %    Platelets 259 150 - 400 K/uL   nRBC 0.0 0.0 - 0.2 %  HIV Antibody (routine testing w rflx)  Result Value Ref Range   HIV Screen 4th Generation wRfx Non Reactive Non Reactive  Comprehensive metabolic panel  Result Value Ref Range   Sodium 133 (L) 135 - 145 mmol/L   Potassium 4.0 3.5 - 5.1 mmol/L   Chloride 100 98 - 111 mmol/L   CO2 22 22 - 32 mmol/L   Glucose, Bld 88 70 - 99 mg/dL   BUN 13 6 - 20 mg/dL   Creatinine, Ser 1.26 (H) 0.61 - 1.24 mg/dL   Calcium 8.7 (L) 8.9 - 10.3 mg/dL   Total Protein 7.9 6.5 - 8.1 g/dL   Albumin 4.0 3.5 - 5.0 g/dL   AST 23 15 - 41 U/L   ALT 30 0 - 44 U/L   Alkaline Phosphatase 74 38 - 126 U/L  Total Bilirubin 0.8 0.3 - 1.2 mg/dL   GFR, Estimated >60 >60 mL/min   Anion gap 11 5 - 15  Magnesium  Result Value Ref Range   Magnesium 1.8 1.7 - 2.4 mg/dL  CBC WITH DIFFERENTIAL  Result Value Ref Range   WBC 5.4 4.0 - 10.5 K/uL   RBC 4.60 4.22 - 5.81 MIL/uL   Hemoglobin 13.4 13.0 - 17.0 g/dL   HCT 40.5 39.0 - 52.0 %   MCV 88.0 80.0 - 100.0 fL   MCH 29.1 26.0 - 34.0 pg   MCHC 33.1 30.0 - 36.0 g/dL   RDW 13.8 11.5 - 15.5 %   Platelets 258 150 - 400 K/uL   nRBC 0.0 0.0 - 0.2 %   Neutrophils Relative % 61 %   Neutro Abs 3.3 1.7 - 7.7 K/uL   Lymphocytes Relative 26 %   Lymphs Abs 1.4 0.7 - 4.0 K/uL   Monocytes Relative 11 %   Monocytes Absolute 0.6 0.1 - 1.0 K/uL   Eosinophils Relative 0 %   Eosinophils Absolute 0.0 0.0 - 0.5 K/uL   Basophils Relative 1 %   Basophils Absolute 0.0 0.0 - 0.1 K/uL   Immature Granulocytes 1 %   Abs Immature Granulocytes 0.03 0.00 - 0.07 K/uL  ECHOCARDIOGRAM COMPLETE  Result Value Ref Range   Weight 4,080 oz   Height 76 in   BP 118/92 mmHg   Single Plane A2C EF 54.0 %   Single Plane A4C EF 38.5 %   Calc EF 44.9 %   AR max vel 2.11 cm2   AV Area VTI 2.01 cm2   AV Mean grad 4.0 mmHg   AV Peak grad 7.5 mmHg   Ao pk vel 1.37 m/s   AV Area mean vel 1.88 cm2   MV VTI 2.86 cm2   Area-P 1/2 4.65 cm2   S' Lateral  3.10 cm   P 1/2 time 670 msec  Troponin I (High Sensitivity)  Result Value Ref Range   Troponin I (High Sensitivity) 18 (H) <18 ng/L  Troponin I (High Sensitivity)  Result Value Ref Range   Troponin I (High Sensitivity) 19 (H) <18 ng/L   C. Wynetta Emery, MD [facility]   01/19/2021  6:53 PM How to contact the [facility] Attending or Consulting provider [place] or covering provider during after hours [place], for this patient?  Check the care team in [facility] and look for a) attending/consulting [facility] provider listed and b) the [facility] team listed Log into [email or web address] and use [place]'s universal password to access. If you do not have the password, please contact the hospital operator. Locate the [facility] provider you are looking for under [facility] and page to a number that you can be directly reached. If you still have difficulty reaching the provider, please page the [facility] (Director on Call) for the Hospitalists listed on amion for assistance.

## 2021-01-20 NOTE — H&P (Signed)
TRH H&P    Patient Demographics:    Keith Norman, is a 41 y.o. male  MRN: [id number]  DOB - [birth year]  Admit Date - 01/19/2021  Referring MD/NP/PA: Tiburcio Pea  Outpatient Primary MD for the patient is Pcp, No  Patient coming from: Home  Chief complaint- dyspnea/cough   HPI:    Keith Norman  is a 41 y.o. male, with history of hypertension and pulmonary emboli presents ED with a chief complaint of dyspnea and cough.  Patient reports that he has been feeling sick since Thanksgiving.  He has had body aches and chills.  It went away and then came back in December.  Patient reports that he has a history of blood clot.  He was on Xarelto, but was no longer able to afford it.  He was started on Eliquis 4 or 5 months ago.  He reports that he has been taking Eliquis despite a side effect of making his legs swell.  Since he has been in [place] he has not had insurance, but reports he still have a supply of Eliquis for which he was taking.  Patient reports that he had a hematology work-up in [place] that revealed no etiology behind his blood clot.  He reports a family history of his grandmother being on warfarin which he does not want to be on.  He started feeling short of breath 1-2 weeks ago.  Been progressively worse.  He has had associated cough that is been progressively worse.  He has felt feverish but had no measured fever at home.  Patient has had chest pain and back pain.  Worse with deep inspiration.  He has not had hemoptysis.  He has not had any recent surgeries, testosterone, or immobility.  Patient does report driving from [place] to [place] 4 or 5 months ago.  They stopped every 3 hours to walk.  Patient has no other complaints at this time.  Patient does not smoke, does not drink, does not use illicit drugs.  He is vaccinated for COVID.  Full code.  In the ED Patient was febrile, tachycardic,  tachypneic, elevated blood pressure.  Troponins 18 and 19.  COVID positive. CTA shows multiple bilateral PEs. EKG shows sinus tachycardia with a rate of 113, QTc 482 Patient started on full dose Lovenox Patient is out of window for COVID if he has been symptomatic since Thanksgiving    Review of systems:    In addition to the HPI above,  Admits to subjective fever No Headache, No changes with Vision or hearing, No problems swallowing food or Liquids, Admits to chest pain, admits to cough and shortness of breath No Abdominal pain, No Nausea or Vomiting, bowel movements are regular, No Blood in stool or Urine, No dysuria, No new skin rashes or bruises, No new weakness, tingling, numbness in any extremity, No recent weight gain or loss, No polyuria, polydypsia or polyphagia, No significant Mental Stressors.  All other systems reviewed and are negative.    Past History of the following :    Past  Medical History:  Diagnosis Date   Hypertension    Pulmonary embolism (HCC)       History reviewed. No pertinent surgical history.    Social History:      Social History   Tobacco Use   Smoking status: Never   Smokeless tobacco: Never  Substance Use Topics   Alcohol use: Yes       Family History :     Family History  Problem Relation Age of Onset   Healthy Mother    Healthy Father       Home Medications:   Prior to Admission medications   Medication Sig Start Date End Date Taking? Authorizing Provider  amLODipine (NORVASC) 5 MG tablet Take 1 tablet (5 mg total) by mouth daily. 03/18/18   Altamese Dilling, MD  ELIQUIS DVT/PE STARTER PACK (ELIQUIS STARTER PACK) 5 MG TABS Take as directed on package: start with two-5mg  tablets twice daily for 7 days. On day 8, switch to one-5mg  tablet twice daily. 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 All nuts   Lac Bovis Diarrhea     Physical Exam:   Vitals  Blood pressure 123/83, pulse (!) 111, temperature 100.1 F (37.8 C), temperature source Oral, resp. rate 17, height 6\' 4"  (1.93 m), weight 115.7 kg, SpO2 96 %.   1.  General: Patient lying supine in bed,  no acute distress   2. Psychiatric: Alert and oriented x 3, mood and behavior normal for situation, pleasant and cooperative with exam   3. Neurologic: Speech and language are normal, face is symmetric, moves all 4 extremities voluntarily, at baseline without acute deficits on limited exam   4. HEENMT:  Head is atraumatic, normocephalic, pupils reactive to light, neck is supple, trachea is midline, mucous membranes are moist   5. Respiratory : Lungs are clear to auscultation bilaterally without wheezing, rhonchi, rales, no cyanosis, no increase in work of breathing or accessory muscle use   6. Cardiovascular : Heart rate normal, rhythm is regular, no murmurs, rubs or gallops, no peripheral edema, peripheral pulses palpated   7. Gastrointestinal:  Abdomen is soft, nondistended, nontender to palpation bowel sounds active, no masses or organomegaly palpated   8. Skin:  Skin is warm, dry and intact without rashes, acute lesions, or ulcers on limited exam   9.Musculoskeletal:  No acute deformities or trauma, no asymmetry in tone, no peripheral edema, peripheral pulses palpated, no tenderness to palpation in the extremities     Data Review:    CBC Recent Labs  Lab 01/19/21 1615 01/20/21 0400  WBC 4.8 5.8  HGB 13.7 13.5  HCT 41.9 40.6  PLT 265 259  MCV 88.4 86.6  MCH 28.9 28.8  MCHC 32.7 33.3  RDW 13.5 13.6   ------------------------------------------------------------------------------------------------------------------  Results for orders placed or performed during the hospital encounter of 01/19/21 (from the past 48 hour(s))  Basic metabolic panel     Status: Abnormal    Collection Time: 01/19/21  4:15 PM  Result Value Ref Range   Sodium 138 135 - 145 mmol/L   Potassium 4.7 3.5 - 5.1 mmol/L   Chloride 104 98 - 111 mmol/L   CO2 23 22 - 32 mmol/L   Glucose, Bld 103 (H) 70 - 99 mg/dL    Comment: Glucose reference range applies only to  samples taken after fasting for at least 8 hours.   BUN 14 6 - 20 mg/dL   Creatinine, Ser 1.611.21 0.61 - 1.24 mg/dL   Calcium 9.1 8.9 - 09.610.3 mg/dL   GFR, Estimated >04>60 >54>60 mL/min    Comment: (NOTE) Calculated using the CKD-EPI Creatinine Equation (2021)    Anion gap 11 5 - 15    Comment: Performed at [facility], [address] [place], [place] [address]  CBC     Status: None   Collection Time: 01/19/21  4:15 PM  Result Value Ref Range   WBC 4.8 4.0 - 10.5 K/uL   RBC 4.74 4.22 - 5.81 MIL/uL   Hemoglobin 13.7 13.0 - 17.0 g/dL   HCT 91.441.9 78.239.0 - 95.652.0 %   MCV 88.4 80.0 - 100.0 fL   MCH 28.9 26.0 - 34.0 pg   MCHC 32.7 30.0 - 36.0 g/dL   RDW 21.313.5 08.611.5 - 57.815.5 %   Platelets 265 150 - 400 K/uL   nRBC 0.0 0.0 - 0.2 %    Comment: Performed at [facility], [address] [place], [place] [address]  Troponin I (High Sensitivity)     Status: Abnormal   Collection Time: 01/19/21  4:15 PM  Result Value Ref Range   Troponin I (High Sensitivity) 18 (H) <18 ng/L    Comment: (NOTE) Elevated high sensitivity troponin I (hsTnI) values and significant  changes across serial measurements may suggest ACS but many other  chronic and acute conditions are known to elevate hsTnI results.  Refer to the "Links" section for chest pain algorithms and additional  guidance. Performed at [facility], [address] [place], [place] [address]   Troponin I (High Sensitivity)     Status: Abnormal   Collection Time: 01/19/21  6:27 PM  Result Value Ref Range   Troponin I (High Sensitivity) 19 (H) <18 ng/L    Comment: (NOTE) Elevated high sensitivity troponin I (hsTnI) values and significant  changes across serial measurements may suggest ACS but  many other  chronic and acute conditions are known to elevate hsTnI results.  Refer to the "Links" section for chest pain algorithms and additional  guidance. Performed at [facility], [address] [place], [place] [address]   Resp Panel by RT-PCR (Flu A&B, Covid) Nasopharyngeal Swab     Status: Abnormal   Collection Time: 01/19/21  6:56 PM   Specimen: Nasopharyngeal Swab; Nasopharyngeal(NP) swabs in vial transport medium  Result Value Ref Range   SARS Coronavirus 2 by RT PCR POSITIVE (A) NEGATIVE    Comment: (NOTE) SARS-CoV-2 target nucleic acids are DETECTED.  The SARS-CoV-2 RNA is generally detectable in upper respiratory specimens during the acute phase of infection. Positive results are indicative of the presence of the identified virus, but do not rule out bacterial infection or co-infection with other pathogens not detected by the test. Clinical correlation with patient history and other diagnostic information is necessary to determine patient infection status. The expected result is Negative.  Fact Sheet for Patients: BloggerCourse.comhttps://www.fda.gov/media/152166/download  Fact Sheet for Healthcare Providers: SeriousBroker.ithttps://www.fda.gov/media/152162/download  This test is not yet approved or cleared by the [place] FDA and  has been authorized for detection and/or diagnosis of SARS-CoV-2 by FDA under an Emergency Use Authorization (EUA).  This EUA will remain in effect (meaning this test can be used) for the duration of  the COVID-19 declaration under Section 564(b)(1) of the A ct, 21 U.S.C. section 360bbb-3(b)(1), unless the authorization is terminated or revoked sooner.  Influenza A by PCR NEGATIVE NEGATIVE   Influenza B by PCR NEGATIVE NEGATIVE    Comment: (NOTE) The Xpert Xpress SARS-CoV-2/FLU/RSV plus assay is intended as an aid in the diagnosis of influenza from Nasopharyngeal swab specimens and should not be used as a sole basis for treatment. Nasal washings  and aspirates are unacceptable for Xpert Xpress SARS-CoV-2/FLU/RSV testing.  Fact Sheet for Patients: BloggerCourse.com  Fact Sheet for Healthcare Providers: SeriousBroker.it  This test is not yet approved or cleared by the [place] FDA and has been authorized for detection and/or diagnosis of SARS-CoV-2 by FDA under an Emergency Use Authorization (EUA). This EUA will remain in effect (meaning this test can be used) for the duration of the COVID-19 declaration under Section 564(b)(1) of the Act, 21 U.S.C. section 360bbb-3(b)(1), unless the authorization is terminated or revoked.  Performed at [facility], [address] [place], [place] [address]   CBC     Status: None   Collection Time: 01/20/21  4:00 AM  Result Value Ref Range   WBC 5.8 4.0 - 10.5 K/uL   RBC 4.69 4.22 - 5.81 MIL/uL   Hemoglobin 13.5 13.0 - 17.0 g/dL   HCT 60.4 54.0 - 98.1 %   MCV 86.6 80.0 - 100.0 fL   MCH 28.8 26.0 - 34.0 pg   MCHC 33.3 30.0 - 36.0 g/dL   RDW 19.1 47.8 - 29.5 %   Platelets 259 150 - 400 K/uL   nRBC 0.0 0.0 - 0.2 %    Comment: Performed at [facility], [address], [place], [place] [address]    Chemistries  Recent Labs  Lab 01/19/21 1615  NA 138  K 4.7  CL 104  CO2 23  GLUCOSE 103*  BUN 14  CREATININE 1.21  CALCIUM 9.1   ------------------------------------------------------------------------------------------------------------------  ------------------------------------------------------------------------------------------------------------------ GFR: Estimated Creatinine Clearance: 115.2 mL/min (by C-G formula based on SCr of 1.21 mg/dL). Liver Function Tests: No results for input(s): AST, ALT, ALKPHOS, BILITOT, PROT, ALBUMIN in the last 168 hours. No results for input(s): LIPASE, AMYLASE in the last 168 hours. No results for input(s): AMMONIA in the last 168 hours. Coagulation Profile: No results for input(s):  INR, PROTIME in the last 168 hours. Cardiac Enzymes: No results for input(s): CKTOTAL, CKMB, CKMBINDEX, TROPONINI in the last 168 hours. BNP (last 3 results) No results for input(s): PROBNP in the last 8760 hours. HbA1C: No results for input(s): HGBA1C in the last 72 hours. CBG: No results for input(s): GLUCAP in the last 168 hours. Lipid Profile: No results for input(s): CHOL, HDL, LDLCALC, TRIG, CHOLHDL, LDLDIRECT in the last 72 hours. Thyroid Function Tests: No results for input(s): TSH, T4TOTAL, FREET4, T3FREE, THYROIDAB in the last 72 hours. Anemia Panel: No results for input(s): VITAMINB12, FOLATE, FERRITIN, TIBC, IRON, RETICCTPCT in the last 72 hours.  --------------------------------------------------------------------------------------------------------------- Urine analysis:    Component Value Date/Time   COLORURINE YELLOW (A) 03/16/2018 2301   APPEARANCEUR CLEAR (A) 03/16/2018 2301   LABSPEC >1.046 (H) 03/16/2018 2301   PHURINE 6.0 03/16/2018 2301   GLUCOSEU NEGATIVE 03/16/2018 2301   HGBUR SMALL (A) 03/16/2018 2301   BILIRUBINUR NEGATIVE 03/16/2018 2301   KETONESUR 20 (A) 03/16/2018 2301   PROTEINUR 100 (A) 03/16/2018 2301   NITRITE NEGATIVE 03/16/2018 2301   LEUKOCYTESUR NEGATIVE 03/16/2018 2301      Imaging Results:    DG Chest 2 View  Result Date: 01/19/2021 CLINICAL DATA:  Shortness of breath, cough. EXAM: CHEST - 2 VIEW COMPARISON:  March 16, 2018. FINDINGS:  The heart size and mediastinal contours are within normal limits. Both lungs are clear. The visualized skeletal structures are unremarkable. IMPRESSION: No active cardiopulmonary disease. Electronically Signed   By: Lupita RaiderJames  Green Jr M.D.   On: 01/19/2021 16:01   CT Angio Chest PE W and/or Wo Contrast  Result Date: 01/19/2021 CLINICAL DATA:  Pulmonary embolism (PE) suspected, high prob. Pt arrives with c/o exertional SOB that started about a month ago. Pt denies fevers. Pt endorses generalized body aches  and cough. history of PE. COVID-19 infection positive. EXAM: CT ANGIOGRAPHY CHEST WITH CONTRAST TECHNIQUE: Multidetector CT imaging of the chest was performed using the standard protocol during bolus administration of intravenous contrast. Multiplanar CT image reconstructions and MIPs were obtained to evaluate the vascular anatomy. CONTRAST:  100mL OMNIPAQUE IOHEXOL 350 MG/ML SOLN COMPARISON:  CT angiography chest 03/16/2018 FINDINGS: Cardiovascular: Satisfactory opacification of the pulmonary arteries to the segmental level. No evidence of pulmonary embolism. Normal heart size. No significant pericardial effusion. The thoracic aorta is normal in caliber. No atherosclerotic plaque of the thoracic aorta. No definite coronary artery calcifications. Mediastinum/Nodes: No enlarged mediastinal, hilar, or axillary lymph nodes. Thyroid gland, trachea, and esophagus demonstrate no significant findings. Lungs/Pleura: Right apical paraseptal emphysematous changes. Right apical pleural/pulmonary scarring. Cannot excluded developing pulmonary infarction within the lingula (6:70). There is a 2.2 x 1.4 cm ground-glass peripheral left upper lobe airspace opacity. No pulmonary mass. No pleural effusion. No pneumothorax. Upper Abdomen: No acute abnormality. Musculoskeletal: No chest wall abnormality. No suspicious lytic or blastic osseous lesions. No acute displaced fracture. Multilevel degenerative changes of the spine. Review of the MIP images confirms the above findings. IMPRESSION: 1. Distal central left pulmonary embolus as well as all five pulmonary lobes segmental and subsegmental pulmonary emboli. No associated right heart strain. Cannot excluded developing pulmonary infarction within the lingula. 2. Indeterminate 2.2 x 1.4 cm ground-glass peripheral left upper lobe airspace opacity. Recommend short-term repeat CT in 3 months once COVID-19 infection is treated to evaluate for stability. Non-contrast chest CT at 3-6 months is  recommended. If nodules persist, subsequent management will be based upon the most suspicious nodule(s). This recommendation follows the consensus statement: Guidelines for Management of Incidental Pulmonary Nodules Detected on CT Images: From the Fleischner Society 2017; Radiology 2017; 284:228-243. 3.  Emphysema (ICD10-J43.9). These results were called by telephone at the time of interpretation on 01/19/2021 at 9:30 pm to provider ABIGAIL HARRIS , who verbally acknowledged these results. Electronically Signed   By: Tish FredericksonMorgane  Naveau M.D.   On: 01/19/2021 21:36       Assessment & Plan:    Principal Problem:   Acute pulmonary embolism (HCC)   Acute pulmonary embolism Failed Eliquis Reports no failure of Xarelto and prefers to be back on Xarelto Started on full dose Lovenox Discussed possibility of warfarin and patient with no insurance, but patient is not interested Echo in a.m. and ultrasound DVT bilaterally Continue full dose Lovenox Monitor on telemetry Hypertension BP in the ED 160/110 Continue amlodipine  DVT Prophylaxis-   Lovenox AM Labs Ordered, also please review Full Orders  Family Communication: Admission, patients condition and plan of care including tests being ordered have been discussed with the patient and wife who indicate understanding and agree with the plan and Code Status.  Code Status: Full  Admission status: Inpatient :The appropriate admission status for this patient is INPATIENT. Inpatient status is judged to be reasonable and necessary in order to provide the required intensity of service to ensure the patient's  safety. The patient's presenting symptoms, physical exam findings, and initial radiographic and laboratory data in the context of their chronic comorbidities is felt to place them at high risk for further clinical deterioration. Furthermore, it is not anticipated that the patient will be medically stable for discharge from the hospital within 2 midnights of  admission. The following factors support the admission status of inpatient.     The patient's presenting symptoms include dyspnea. The worrisome physical exam findings include tachycardia. The initial radiographic and laboratory data are worrisome because of PE. The chronic co-morbidities include history of PE, hypertension.       * I certify that at the point of admission it is my clinical judgment that the patient will require inpatient hospital care spanning beyond 2 midnights from the point of admission due to high intensity of service, high risk for further deterioration and high frequency of surveillance required.*  Disposition: Anticipated Discharge date 48 hours discharge to home  Time spent in minutes : 65   Lajune Perine B Zierle-Ghosh DO

## 2021-01-20 NOTE — Discharge Planning (Signed)
Oncology Discharge Planning Admission Note  [facility] Address: [address] [place], [place] [address] Hours of Operation:  8am - 5pm, Monday - Friday  Clinic Contact Information:  [phone]  Oncology/Hematology Care Team: Medical Oncologist:  Doreatha Massed  Dr. Laural Benes called to inform of consult on this patient.  Dr. Ellin Saba is aware of this hospital admission dated 01/19/2021, and the cancer center will follow [facility] inpatient care to assist with discharge planning as indicated by the oncologist.  We will reach out to you closer to discharge date to arrange your follow up care.  Disclaimer:  This Cancer Center nursing note does not imply a formal consult request has been made by the admitting attending for this admission or there will be an inpatient consult completed by oncology.  Please request oncology consults as per standard process as indicated.

## 2021-01-20 NOTE — Progress Notes (Signed)
°  Transition of Care [facility]) Screening Note   Patient Details  Name: Keith Norman Date of Birth: 1980/07/21   Transition [facility] Care [facility]) CM/SW Contact:    Leitha Bleak, RN Phone Number: 01/20/2021, 11:49 AM    Transition of Care Department [facility]) has reviewed patient and no TOC needs have been identified at this time. We will continue to monitor patient advancement through interdisciplinary progression rounds. If new patient transition needs arise, please place a TOC consult.

## 2021-01-21 ENCOUNTER — Encounter (HOSPITAL_COMMUNITY): Payer: Self-pay | Admitting: Family Medicine

## 2021-01-21 DIAGNOSIS — I824Z2 Acute embolism and thrombosis of unspecified deep veins of left distal lower extremity: Secondary | ICD-10-CM

## 2021-01-21 LAB — CBC
HCT: 38 % — ABNORMAL LOW (ref 39.0–52.0)
Hemoglobin: 12.4 g/dL — ABNORMAL LOW (ref 13.0–17.0)
MCH: 28.5 pg (ref 26.0–34.0)
MCHC: 32.6 g/dL (ref 30.0–36.0)
MCV: 87.4 fL (ref 80.0–100.0)
Platelets: 212 10*3/uL (ref 150–400)
RBC: 4.35 MIL/uL (ref 4.22–5.81)
RDW: 13.4 % (ref 11.5–15.5)
WBC: 4.6 10*3/uL (ref 4.0–10.5)
nRBC: 0 % (ref 0.0–0.2)

## 2021-01-21 LAB — BASIC METABOLIC PANEL
Anion gap: 9 (ref 5–15)
BUN: 9 mg/dL (ref 6–20)
CO2: 25 mmol/L (ref 22–32)
Calcium: 8.3 mg/dL — ABNORMAL LOW (ref 8.9–10.3)
Chloride: 101 mmol/L (ref 98–111)
Creatinine, Ser: 1.27 mg/dL — ABNORMAL HIGH (ref 0.61–1.24)
GFR, Estimated: >60 mL/min (ref >60)
Glucose, Bld: 96 mg/dL (ref 70–99)
Potassium: 3.9 mmol/L (ref 3.5–5.1)
Sodium: 135 mmol/L (ref 135–145)

## 2021-01-21 MED ORDER — ENOXAPARIN SODIUM 120 MG/0.8ML IJ SOSY: 120.0000 mg | PREFILLED_SYRINGE | Freq: Two times a day (BID) | INTRAMUSCULAR | Status: DC

## 2021-01-21 MED ORDER — ENOXAPARIN SODIUM 120 MG/0.8ML IJ SOSY: 120.0000 mg | PREFILLED_SYRINGE | Freq: Two times a day (BID) | INTRAMUSCULAR | 2 refills | Status: DC

## 2021-01-21 MED ORDER — ACETAMINOPHEN 325 MG PO TABS: 650.0000 mg | ORAL_TABLET | Freq: Four times a day (QID) | ORAL | Status: AC | PRN

## 2021-01-21 MED ORDER — AMLODIPINE BESYLATE 5 MG PO TABS: 5.0000 mg | ORAL_TABLET | Freq: Every day | ORAL | 1 refills | Status: AC

## 2021-01-21 NOTE — TOC Transition Note (Signed)
Transition of Care [facility]) - CM/SW Discharge Note   Patient Details  Name: Keith Norman MRN: [id number] Date of Birth: Jul 25, 1980  Transition of Care [facility]) CM/SW Contact:  Leitha Bleak, RN Phone Number: 01/21/2021, 1:53 PM   Clinical Narrative:   Patient discharging home. PT is in need of MATCH voucher for Lovenox. MATCH voucher given and call to explain. Patient is COVID positive. Patient is aware to follow up with Dr.K and work out a medication plan. Voucher is good for 30 day supply only.   Final next level of care: Home/Self Care Barriers to Discharge: Barriers Resolved  Patient Goals and CMS Choice Patient states their goals for this hospitalization and ongoing recovery are:: to go home. CMS Medicare.gov Compare Post Acute Care list provided to:: Patient    Discharge Placement           Patient and family notified of of transfer: 01/21/21  Discharge Plan and Services      Readmission Risk Interventions Readmission Risk Prevention Plan 01/21/2021  Post Dischage Appt Complete  Medication Screening Complete  Transportation Screening Complete  Some recent data might be hidden

## 2021-01-21 NOTE — Discharge Summary (Signed)
Physician Discharge Summary  Keith Norman [id number] DOB: 08-27-1980 DOA: 01/19/2021  Admit date: 01/19/2021 Discharge date: 01/21/2021  Admitted From:  HOME  Disposition: HOME   Recommendations for Outpatient Follow-up:  TOC consulted to assist patient to establish a PCP Ambulatory referral to hematology requested for 2-3 weeks Pt given MATCH voucher to obtain enoxaparin injections   Discharge Condition: STABLE   CODE STATUS: FULL DIET: Heart Healthy    Brief Hospitalization Summary: Please see all hospital notes, images, labs for full details of the hospitalization. ADMISSION HPI:  Keith Norman  is a 41 y.o. male, with history of hypertension and pulmonary emboli presents ED with a chief complaint of dyspnea and cough.  Patient reports that he has been feeling sick since Thanksgiving.  He has had body aches and chills.  It went away and then came back in December.  Patient reports that he has a history of blood clot.  He was on Xarelto, but was no longer able to afford it.  He was started on Eliquis 4 or 5 months ago.  He reports that he has been taking Eliquis despite a side effect of making his legs swell.  Since he has been in [place] he has not had insurance, but reports he still have a supply of Eliquis for which he was taking.  Patient reports that he had a hematology work-up in [place] that revealed no etiology behind his blood clot.  He reports a family history of his grandmother being on warfarin which he does not want to be on.  He started feeling short of breath 1-2 weeks ago.  Been progressively worse.  He has had associated cough that is been progressively worse.  He has felt feverish but had no measured fever at home.  Patient has had chest pain and back pain.  Worse with deep inspiration.  He has not had hemoptysis.  He has not had any recent surgeries, testosterone, or immobility.  Patient does report driving from [place] to [place] 4 or 5 months ago.   They stopped every 3 hours to walk.  Patient has no other complaints at this time.   Patient does not smoke, does not drink, does not use illicit drugs.  He is vaccinated for COVID.  Full code.   In the ED Patient was febrile, tachycardic, tachypneic, elevated blood pressure.  Troponins 18 and 19.  COVID positive. CTA shows multiple bilateral PEs. EKG shows sinus tachycardia with a rate of 113, QTc 482 Patient started on full dose Lovenox Patient is out of window for COVID if he has been symptomatic since Thanksgiving  HOSPITAL COURSE  Pt was admitted with acute PE and DVT reportedly "failed apixaban" but compliance on the apixaban has been very questionable.  Pt arrived here from [place].  No medical insurance.  He was placed on lovenox injections. He was trained on home lovenox injections.  He was sent for a 2D echocardiogram - below with findings of reduced LVEF 50%. TOC was consulted to assist with medications and PCP establishment and provided a Match voucher for him to receive the enoxaparin injections.  He has agreed to take this and agreed to see our hematologist.  [facility].  He is asymptomatic now and is on room air oxygen and stable.  He can safely discharge home but will need to continue enoxaparin injections.  He is declining to take warfarin at this time after multiple separate discussions with him.  Pt  strongly advised that he has to take this anticoagulation for life and if he stops again remains High Risk for adverse outcome and death.  He has decisional capacity and verbalized understanding.     Discharge Diagnoses:  Principal Problem:   Acute pulmonary embolism ([employer]) Active Problems:   Acute deep vein thrombosis (DVT) of left lower extremity [facility])  Discharge Instructions: Discharge Instructions     Ambulatory referral to Hematology / Oncology   Complete by: As directed    Hospital follow up DVT/PE      Allergies as of 01/21/2021        Reactions   Black Walnut Pollen Allergy Skin Test Anaphylaxis   Other Anaphylaxis   Throat closes, gets hives. All nuts   Lac Bovis Diarrhea        Medication List     STOP taking these medications    Eliquis 5 MG Tabs tablet Generic drug: apixaban   Eliquis DVT/PE Starter Pack 5 MG Tabs       TAKE these medications    acetaminophen 325 MG tablet Commonly known as: TYLENOL Take 2 tablets (650 mg total) by mouth every 6 (six) hours as needed for mild pain (or Fever >/= 101).   amLODipine 5 MG tablet Commonly known as: NORVASC Take 1 tablet (5 mg total) by mouth daily.   enoxaparin 120 MG/0.8ML injection Commonly known as: LOVENOX Inject 0.8 mLs (120 mg total) into the skin every 12 (twelve) hours.        Follow-up Information     Derek Jack, MD. Schedule an appointment as soon as possible for a visit in 2 week(s).   Specialty: Hematology Why: Hospital Follow Up Contact information: [place] [address] [phone]                Allergies  Allergen Reactions   Black Walnut Pollen Allergy Skin Test Anaphylaxis   Other Anaphylaxis    Throat closes, gets hives. All nuts   Lac Bovis Diarrhea   Allergies as of 01/21/2021       Reactions   Black Walnut Pollen Allergy Skin Test Anaphylaxis   Other Anaphylaxis   Throat closes, gets hives. All nuts   Lac Bovis Diarrhea        Medication List     STOP taking these medications    Eliquis 5 MG Tabs tablet Generic drug: apixaban   Eliquis DVT/PE Starter Pack 5 MG Tabs       TAKE these medications    acetaminophen 325 MG tablet Commonly known as: TYLENOL Take 2 tablets (650 mg total) by mouth every 6 (six) hours as needed for mild pain (or Fever >/= 101).   amLODipine 5 MG tablet Commonly known as: NORVASC Take 1 tablet (5 mg total) by mouth daily.   enoxaparin 120 MG/0.8ML injection Commonly known as: LOVENOX Inject 0.8 mLs (120 mg total) into the skin every  12 (twelve) hours.        Procedures/Studies: DG Chest 2 View  Result Date: 01/19/2021 CLINICAL DATA:  Shortness of breath, cough. EXAM: CHEST - 2 VIEW COMPARISON:  March 16, 2018. FINDINGS: The heart size and mediastinal contours are within normal limits. Both lungs are clear. The visualized skeletal structures are unremarkable. IMPRESSION: No active cardiopulmonary disease. Electronically Signed   By: Marijo Conception M.D.   On: 01/19/2021 16:01   CT Angio Chest PE W and/or Wo Contrast  Result Date: 01/19/2021 CLINICAL DATA:  Pulmonary embolism (PE) suspected,  high prob. Pt arrives with c/o exertional SOB that started about a month ago. Pt denies fevers. Pt endorses generalized body aches and cough. history of PE. COVID-19 infection positive. EXAM: CT ANGIOGRAPHY CHEST WITH CONTRAST TECHNIQUE: Multidetector CT imaging of the chest was performed using the standard protocol during bolus administration of intravenous contrast. Multiplanar CT image reconstructions and MIPs were obtained to evaluate the vascular anatomy. CONTRAST:  167mL OMNIPAQUE IOHEXOL 350 MG/ML SOLN COMPARISON:  CT angiography chest 03/16/2018 FINDINGS: Cardiovascular: Satisfactory opacification of the pulmonary arteries to the segmental level. No evidence of pulmonary embolism. Normal heart size. No significant pericardial effusion. The thoracic aorta is normal in caliber. No atherosclerotic plaque of the thoracic aorta. No definite coronary artery calcifications. Mediastinum/Nodes: No enlarged mediastinal, hilar, or axillary lymph nodes. Thyroid gland, trachea, and esophagus demonstrate no significant findings. Lungs/Pleura: Right apical paraseptal emphysematous changes. Right apical pleural/pulmonary scarring. Cannot excluded developing pulmonary infarction within the lingula (6:70). There is a 2.2 x 1.4 cm ground-glass peripheral left upper lobe airspace opacity. No pulmonary mass. No pleural effusion. No pneumothorax. Upper  Abdomen: No acute abnormality. Musculoskeletal: No chest wall abnormality. No suspicious lytic or blastic osseous lesions. No acute displaced fracture. Multilevel degenerative changes of the spine. Review of the MIP images confirms the above findings. IMPRESSION: 1. Distal central left pulmonary embolus as well as all five pulmonary lobes segmental and subsegmental pulmonary emboli. No associated right heart strain. Cannot excluded developing pulmonary infarction within the lingula. 2. Indeterminate 2.2 x 1.4 cm ground-glass peripheral left upper lobe airspace opacity. Recommend short-term repeat CT in 3 months once COVID-19 infection is treated to evaluate for stability. Non-contrast chest CT at 3-6 months is recommended. If nodules persist, subsequent management will be based upon the most suspicious nodule(s). This recommendation follows the consensus statement: Guidelines for Management of Incidental Pulmonary Nodules Detected on CT Images: From the Fleischner Society 2017; Radiology 2017; 284:228-243. 3.  Emphysema (ICD10-J43.9). These results were called by telephone at the time of interpretation on 01/19/2021 at 9:30 pm to provider ABIGAIL HARRIS , who verbally acknowledged these results. Electronically Signed   By: Iven Finn M.D.   On: 01/19/2021 21:36   US Venous Img Lower Bilateral (DVT)  Result Date: 01/20/2021 CLINICAL DATA:  Recent diagnosis of pulmonary embolism. Evaluate for DVT. EXAM: BILATERAL LOWER EXTREMITY VENOUS DOPPLER ULTRASOUND TECHNIQUE: Gray-scale sonography with graded compression, as well as color Doppler and duplex ultrasound were performed to evaluate the lower extremity deep venous systems from the level of the common femoral vein and including the common femoral, femoral, profunda femoral, popliteal and calf veins including the posterior tibial, peroneal and gastrocnemius veins when visible. The superficial great saphenous vein was also interrogated. Spectral Doppler was  utilized to evaluate flow at rest and with distal augmentation maneuvers in the common femoral, femoral and popliteal veins. COMPARISON:  None. FINDINGS: RIGHT LOWER EXTREMITY Common Femoral Vein: No evidence of thrombus. Normal compressibility, respiratory phasicity and response to augmentation. Saphenofemoral Junction: No evidence of thrombus. Normal compressibility and flow on color Doppler imaging. Profunda Femoral Vein: No evidence of thrombus. Normal compressibility and flow on color Doppler imaging. There is mixed echogenic occlusive thrombus involving the proximal (image 10), mid (image 17) and distal (image 21) aspects of the right femoral vein, extending to involve the right popliteal vein (image 23) as well as the imaged portions of the right posterior tibial and peroneal veins (image 38). Superficial Great Saphenous Vein: No evidence of thrombus. Normal compressibility. Other Findings: There  is hypoechoic occlusive thrombus involving the right lesser saphenous vein (image 27). LEFT LOWER EXTREMITY There is hypoechoic occlusive thrombus involving the left common femoral vein (image 48), extending to involve the saphenofemoral junction (image 50) as well as the imaged portions of the left deep femoral vein (image 54). There is hypoechoic occlusive thrombus involving the proximal (image 55), mid (image 59) and distal (image 63) aspects of the femoral vein, extending to involve the left popliteal vein (image 67) as well as the imaged aspects of the left tibial veins. Superficial Great Saphenous Vein: No evidence of thrombus. Normal compressibility. Other Findings: There is near occlusive thrombus involving the left lesser saphenous vein (image 71). IMPRESSION: 1. The examination is positive for extensive predominantly occlusive DVT extending from the proximal aspect of the right femoral vein through the imaged right tibial veins. 2. Examination is positive for extensive predominantly occlusive DVT extending  from the left common femoral vein through the imaged left tibial veins. 3. Examination is positive for occlusive/near occlusive superficial thrombophlebitis involving the bilateral lesser saphenous veins. Electronically Signed   By: Sandi Mariscal M.D.   On: 01/20/2021 10:10   ECHOCARDIOGRAM COMPLETE  Result Date: 01/20/2021    ECHOCARDIOGRAM REPORT   Patient Name:   Keith Norman Date of Exam: 01/20/2021 Medical Rec #:  [id number]         Height:       76.0 in Accession #:    [id number]        Weight:       255.0 lb Date of Birth:  10/09/1980         BSA:          2.456 m Patient Age:    16 years          BP:           118/92 mmHg Patient Gender: M                 HR:           95 bpm. Exam Location:  [facility] Procedure: 2D Echo, Cardiac Doppler and Color Doppler Indications:    Pulmonary embolus  History:        Patient has prior history of Echocardiogram examinations, most                 recent 03/17/2018. Signs/Symptoms:Chest Pain; Risk                 Factors:Hypertension.  Sonographer:    Wenda Low Referring Phys: [id number] ASIA B [place]  1. Left ventricular ejection fraction, by estimation, is 50%. The left ventricle has low normal function. Left ventricular endocardial border not optimally defined to evaluate regional wall motion. There is moderate left ventricular hypertrophy. Left ventricular diastolic parameters are indeterminate.  2. Right ventricular systolic function is normal. The right ventricular size is mildly enlarged. Tricuspid regurgitation signal is inadequate for assessing PA pressure.  3. The mitral valve is normal in structure. No evidence of mitral valve regurgitation. No evidence of mitral stenosis.  4. The tricuspid valve is abnormal.  5. The aortic valve is tricuspid. There is moderate calcification of the aortic valve. There is moderate thickening of the aortic valve. Aortic valve regurgitation is mild to moderate.  6. The inferior vena cava is normal in size  with greater than 50% respiratory variability, suggesting right atrial pressure of 3 mmHg. FINDINGS  Left Ventricle: Left ventricular ejection fraction, by estimation, is 50%. The left ventricle  has low normal function. Left ventricular endocardial border not optimally defined to evaluate regional wall motion. The left ventricular internal cavity size was normal in size. There is moderate left ventricular hypertrophy. Left ventricular diastolic parameters are indeterminate. Right Ventricle: The right ventricular size is mildly enlarged. No increase in right ventricular wall thickness. Right ventricular systolic function is normal. Tricuspid regurgitation signal is inadequate for assessing PA pressure. Left Atrium: Left atrial size was normal in size. Right Atrium: Right atrial size was normal in size. Pericardium: There is no evidence of pericardial effusion. Mitral Valve: The mitral valve is normal in structure. No evidence of mitral valve regurgitation. No evidence of mitral valve stenosis. MV peak gradient, 6.7 mmHg. The mean mitral valve gradient is 2.0 mmHg. Tricuspid Valve: The tricuspid valve is abnormal. Tricuspid valve regurgitation is mild . No evidence of tricuspid stenosis. Aortic Valve: The aortic valve is tricuspid. There is moderate calcification of the aortic valve. There is moderate thickening of the aortic valve. There is moderate aortic valve annular calcification. Aortic valve regurgitation is mild to moderate. Aortic regurgitation PHT measures 670 msec. Aortic valve mean gradient measures 4.0 mmHg. Aortic valve peak gradient measures 7.5 mmHg. Aortic valve area, by VTI measures 2.01 cm. Pulmonic Valve: The pulmonic valve was not well visualized. Pulmonic valve regurgitation is mild. No evidence of pulmonic stenosis. Aorta: The aortic root is normal in size and structure. Venous: The inferior vena cava is normal in size with greater than 50% respiratory variability, suggesting right atrial  pressure of 3 mmHg. IAS/Shunts: No atrial level shunt detected by color flow Doppler.  LEFT VENTRICLE PLAX 2D LVIDd:         4.70 cm      Diastology LVIDs:         3.10 cm      LV e' medial:   4.24 cm/s LV PW:         1.50 cm      LV E/e' medial: 9.2 LV IVS:        1.30 cm LVOT diam:     2.10 cm LV SV:         49 LV SV Index:   20 LVOT Area:     3.46 cm  LV Volumes (MOD) LV vol d, MOD A2C: 92.3 ml LV vol d, MOD A4C: 101.0 ml LV vol s, MOD A2C: 42.5 ml LV vol s, MOD A4C: 62.1 ml LV SV MOD A2C:     49.8 ml LV SV MOD A4C:     101.0 ml LV SV MOD BP:      43.9 ml RIGHT VENTRICLE RV Basal diam:  3.75 cm RV Mid diam:    3.70 cm RV S prime:     10.30 cm/s TAPSE (M-mode): 1.9 cm LEFT ATRIUM             Index        RIGHT ATRIUM           Index LA diam:        2.80 cm 1.14 cm/m   RA Area:     18.10 cm LA Vol (A2C):   42.3 ml 17.22 ml/m  RA Volume:   50.90 ml  20.72 ml/m LA Vol (A4C):   35.1 ml 14.29 ml/m LA Biplane Vol: 38.6 ml 15.72 ml/m  AORTIC VALVE                    PULMONIC VALVE AV Area (Vmax):    2.11  cm     PV Vmax:       0.66 m/s AV Area (Vmean):   1.88 cm     PV Peak grad:  1.7 mmHg AV Area (VTI):     2.01 cm AV Vmax:           137.00 cm/s AV Vmean:          96.900 cm/s AV VTI:            0.245 m AV Peak Grad:      7.5 mmHg AV Mean Grad:      4.0 mmHg LVOT Vmax:         83.60 cm/s LVOT Vmean:        52.500 cm/s LVOT VTI:          0.142 m LVOT/AV VTI ratio: 0.58 AI PHT:            670 msec  AORTA Ao Root diam: 3.60 cm Ao Asc diam:  3.70 cm MITRAL VALVE MV Area (PHT): 4.65 cm    SHUNTS MV Area VTI:   2.86 cm    Systemic VTI:  0.14 m MV Peak grad:  6.7 mmHg    Systemic Diam: 2.10 cm MV Mean grad:  2.0 mmHg MV Vmax:       1.29 m/s MV Vmean:      55.2 cm/s MV Decel Time: 163 msec MV E velocity: 38.90 cm/s MV A velocity: 57.30 cm/s MV E/A ratio:  0.68 Carlyle Dolly MD Electronically signed by Carlyle Dolly MD Signature Date/Time: 01/20/2021/1:28:11 PM    Final      Subjective: Pt reports no complaints,  still declines warfarin, says he will have to pay for the xarelto or enoxaparin injections, he is willing to take the enoxaparin injections.    Discharge Exam: Vitals:   01/20/21 2101 01/21/21 0628  BP: 135/77 (!) 124/91  Pulse: 96 92  Resp: 19 16  Temp: 98.8 F (37.1 C) 98.9 F (37.2 C)  SpO2: 98% 94%   Vitals:   01/20/21 1300 01/20/21 1727 01/20/21 2101 01/21/21 0628  BP: 138/88 138/89 135/77 (!) 124/91  Pulse: 99 96 96 92  Resp: 20 20 19 16   Temp: 99 F (37.2 C) 98.9 F (37.2 C) 98.8 F (37.1 C) 98.9 F (37.2 C)  TempSrc: Oral Oral Oral   SpO2:  98% 98% 94%  Weight:      Height:        General: Pt is alert, awake, not in acute distress Cardiovascular: normal S1/S2 +, no rubs, no gallops Respiratory: CTA bilaterally, no wheezing, no rhonchi Abdominal: Soft, NT, ND, bowel sounds + Extremities: swollen legs bilateral.    The results of significant diagnostics from this hospitalization (including imaging, microbiology, ancillary and laboratory) are listed below for reference.     Microbiology: Recent Results (from the past 240 hour(s))  Resp Panel by RT-PCR (Flu A&B, Covid) Nasopharyngeal Swab     Status: Abnormal   Collection Time: 01/19/21  6:56 PM   Specimen: Nasopharyngeal Swab; Nasopharyngeal(NP) swabs in vial transport medium  Result Value Ref Range Status   SARS Coronavirus 2 by RT PCR POSITIVE (A) NEGATIVE Final    Comment: (NOTE) SARS-CoV-2 target nucleic acids are DETECTED.  The SARS-CoV-2 RNA is generally detectable in upper respiratory specimens during the acute phase of infection. Positive results are indicative of the presence of the identified virus, but do not rule out bacterial infection or co-infection with other pathogens not detected by the test.  Clinical correlation with patient history and other diagnostic information is necessary to determine patient infection status. The expected result is Negative.  Fact Sheet for  Patients: EntrepreneurPulse.com.au  Fact Sheet for Healthcare Providers: IncredibleEmployment.be  This test is not yet approved or cleared by the [place] FDA and  has been authorized for detection and/or diagnosis of SARS-CoV-2 by FDA under an Emergency Use Authorization (EUA).  This EUA will remain in effect (meaning this test can be used) for the duration of  the COVID-19 declaration under Section 564(b)(1) of the A ct, 21 U.S.C. section 360bbb-3(b)(1), unless the authorization is terminated or revoked sooner.     Influenza A by PCR NEGATIVE NEGATIVE Final   Influenza B by PCR NEGATIVE NEGATIVE Final    Comment: (NOTE) The Xpert Xpress SARS-CoV-2/FLU/RSV plus assay is intended as an aid in the diagnosis of influenza from Nasopharyngeal swab specimens and should not be used as a sole basis for treatment. Nasal washings and aspirates are unacceptable for Xpert Xpress SARS-CoV-2/FLU/RSV testing.  Fact Sheet for Patients: EntrepreneurPulse.com.au  Fact Sheet for Healthcare Providers: IncredibleEmployment.be  This test is not yet approved or cleared by the [place] FDA and has been authorized for detection and/or diagnosis of SARS-CoV-2 by FDA under an Emergency Use Authorization (EUA). This EUA will remain in effect (meaning this test can be used) for the duration of the COVID-19 declaration under Section 564(b)(1) of the Act, 21 U.S.C. section 360bbb-3(b)(1), unless the authorization is terminated or revoked.  Performed at [facility], [address] [place], [place] [address]      Labs: BNP (last 3 results) No results for input(s): BNP in the last 8760 hours. Basic Metabolic Panel: Recent Labs  Lab 01/19/21 1615 01/20/21 0807 01/21/21 0707  NA 138 133* 135  K 4.7 4.0 3.9  CL 104 100 101  CO2 23 22 25   GLUCOSE 103* 88 96  BUN 14 13 9   CREATININE 1.21 1.26* 1.27*  CALCIUM 9.1 8.7*  8.3*  MG  --  1.8  --    Liver Function Tests: Recent Labs  Lab 01/20/21 0807  AST 23  ALT 30  ALKPHOS 74  BILITOT 0.8  PROT 7.9  ALBUMIN 4.0   No results for input(s): LIPASE, AMYLASE in the last 168 hours. No results for input(s): AMMONIA in the last 168 hours. CBC: Recent Labs  Lab 01/19/21 1615 01/20/21 0400 01/20/21 0807 01/21/21 0707  WBC 4.8 5.8 5.4 4.6  NEUTROABS  --   --  3.3  --   HGB 13.7 13.5 13.4 12.4*  HCT 41.9 40.6 40.5 38.0*  MCV 88.4 86.6 88.0 87.4  PLT 265 259 258 212   Cardiac Enzymes: No results for input(s): CKTOTAL, CKMB, CKMBINDEX, TROPONINI in the last 168 hours. BNP: Invalid input(s): POCBNP CBG: No results for input(s): GLUCAP in the last 168 hours. D-Dimer No results for input(s): DDIMER in the last 72 hours. Hgb A1c No results for input(s): HGBA1C in the last 72 hours. Lipid Profile No results for input(s): CHOL, HDL, LDLCALC, TRIG, CHOLHDL, LDLDIRECT in the last 72 hours. Thyroid function studies No results for input(s): TSH, T4TOTAL, T3FREE, THYROIDAB in the last 72 hours.  Invalid input(s): FREET3 Anemia work up No results for input(s): VITAMINB12, FOLATE, FERRITIN, TIBC, IRON, RETICCTPCT in the last 72 hours. Urinalysis    Component Value Date/Time   COLORURINE YELLOW (A) 03/16/2018 2301   APPEARANCEUR CLEAR (A) 03/16/2018 2301   LABSPEC >1.046 (H) 03/16/2018 2301   PHURINE 6.0  03/16/2018 2301   GLUCOSEU NEGATIVE 03/16/2018 2301   HGBUR SMALL (A) 03/16/2018 2301   BILIRUBINUR NEGATIVE 03/16/2018 2301   KETONESUR 20 (A) 03/16/2018 2301   PROTEINUR 100 (A) 03/16/2018 2301   NITRITE NEGATIVE 03/16/2018 2301   LEUKOCYTESUR NEGATIVE 03/16/2018 2301   Sepsis Labs Invalid input(s): PROCALCITONIN,  WBC,  LACTICIDVEN Microbiology Recent Results (from the past 240 hour(s))  Resp Panel by RT-PCR (Flu A&B, Covid) Nasopharyngeal Swab     Status: Abnormal   Collection Time: 01/19/21  6:56 PM   Specimen: Nasopharyngeal Swab;  Nasopharyngeal(NP) swabs in vial transport medium  Result Value Ref Range Status   SARS Coronavirus 2 by RT PCR POSITIVE (A) NEGATIVE Final    Comment: (NOTE) SARS-CoV-2 target nucleic acids are DETECTED.  The SARS-CoV-2 RNA is generally detectable in upper respiratory specimens during the acute phase of infection. Positive results are indicative of the presence of the identified virus, but do not rule out bacterial infection or co-infection with other pathogens not detected by the test. Clinical correlation with patient history and other diagnostic information is necessary to determine patient infection status. The expected result is Negative.  Fact Sheet for Patients: EntrepreneurPulse.com.au  Fact Sheet for Healthcare Providers: IncredibleEmployment.be  This test is not yet approved or cleared by the [place] FDA and  has been authorized for detection and/or diagnosis of SARS-CoV-2 by FDA under an Emergency Use Authorization (EUA).  This EUA will remain in effect (meaning this test can be used) for the duration of  the COVID-19 declaration under Section 564(b)(1) of the [place], 21 U.S.C. section 360bbb-3(b)(1), unless the authorization is terminated or revoked sooner.     Influenza A by PCR NEGATIVE NEGATIVE Final   Influenza B by PCR NEGATIVE NEGATIVE Final    Comment: (NOTE) The Xpert Xpress SARS-CoV-2/FLU/RSV plus assay is intended as an aid in the diagnosis of influenza from Nasopharyngeal swab specimens and should not be used as a sole basis for treatment. Nasal washings and aspirates are unacceptable for Xpert Xpress SARS-CoV-2/FLU/RSV testing.  Fact Sheet for Patients: EntrepreneurPulse.com.au  Fact Sheet for Healthcare Providers: IncredibleEmployment.be  This test is not yet approved or cleared by the [place] FDA and has been authorized for detection and/or diagnosis of SARS-CoV-2  by FDA under an Emergency Use Authorization (EUA). This EUA will remain in effect (meaning this test can be used) for the duration of the COVID-19 declaration under Section 564(b)(1) of the Act, 21 U.S.C. section 360bbb-3(b)(1), unless the authorization is terminated or revoked.  Performed at [facility], [address], [place], [place] [address]    Time coordinating discharge: 35 mins   SIGNED:  Irwin Brakeman, MD  [facility] 01/21/2021, 12:24 PM How to contact the [facility] Attending or Consulting provider [place] or covering provider during after hours [place], for this patient?  Check the care team in [facility] and look for a) attending/consulting [facility] provider listed and b) the [facility] team listed Log into [email or web address] and use [place]'s universal password to access. If you do not have the password, please contact the hospital operator. Locate the [facility] provider you are looking for under [facility] and page to a number that you can be directly reached. If you still have difficulty reaching the provider, please page the [facility] (Director on Call) for the Hospitalists listed on amion for assistance.

## 2021-01-21 NOTE — Discharge Instructions (Signed)

## 2021-01-21 NOTE — Progress Notes (Signed)
Nsg Discharge Note  Admit Date:  01/19/2021 Discharge date: 01/21/2021   Keith Norman to be D/C'd Home per MD order.  AVS completed.  Copy for chart, and copy for patient signed, and dated. Patient/caregiver able to verbalize understanding.  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 All nuts   Lac Bovis Diarrhea        Medication List     STOP taking these medications    Eliquis 5 MG Tabs tablet Generic drug: apixaban   Eliquis DVT/PE Starter Pack 5 MG Tabs       TAKE these medications    acetaminophen 325 MG tablet Commonly known as: TYLENOL Take 2 tablets (650 mg total) by mouth every 6 (six) hours as needed for mild pain (or Fever >/= 101).   amLODipine 5 MG tablet Commonly known as: NORVASC Take 1 tablet (5 mg total) by mouth daily.   enoxaparin 120 MG/0.8ML injection Commonly known as: LOVENOX Inject 0.8 mLs (120 mg total) into the skin every 12 (twelve) hours.        Discharge Assessment: Vitals:   01/21/21 0628 01/21/21 1500  BP: (!) 124/91 125/84  Pulse: 92 88  Resp: 16 18  Temp: 98.9 F (37.2 C) 98.7 F (37.1 C)  SpO2: 94% 95%   Skin clean, dry and intact without evidence of skin break down, no evidence of skin tears noted. IV catheter discontinued intact. Site without signs and symptoms of complications - no redness or edema noted at insertion site, patient denies c/o pain - only slight tenderness at site.  Dressing with slight pressure applied.  D/c Instructions-Education: Discharge instructions given to patient/family with verbalized understanding. D/c education completed with patient/family including follow up instructions, medication list, d/c activities limitations if indicated, with other d/c instructions as indicated by MD - patient able to verbalize understanding, all questions fully answered. Patient instructed  to return to ED, call 911, or call MD for any changes in condition.  Patient escorted via WC, and D/C home via private auto.  Brandy Hale, LPN 0/08/6759 9:50 PM

## 2021-01-24 NOTE — Progress Notes (Signed)
Pharmacist from [employer]  [facility]) called with questions regarding MATCH voucher limit.  Case Manager informed caller of instructions for Lovenox given at discharge.

## 2021-02-09 ENCOUNTER — Telehealth (HOSPITAL_COMMUNITY): Payer: Self-pay | Admitting: Hematology

## 2021-02-09 ENCOUNTER — Inpatient Hospital Stay (HOSPITAL_COMMUNITY): Payer: Self-pay | Attending: Hematology | Admitting: Hematology

## 2021-02-09 ENCOUNTER — Encounter (HOSPITAL_COMMUNITY): Payer: Self-pay | Admitting: Hematology

## 2021-02-09 ENCOUNTER — Other Ambulatory Visit (HOSPITAL_COMMUNITY): Payer: Self-pay

## 2021-02-09 ENCOUNTER — Other Ambulatory Visit: Payer: Self-pay

## 2021-02-09 VITALS — BP 146/97 | HR 66 | Temp 96.7°F | Resp 18 | Ht 75.98 in | Wt 246.0 lb

## 2021-02-09 DIAGNOSIS — I2699 Other pulmonary embolism without acute cor pulmonale: Secondary | ICD-10-CM | POA: Insufficient documentation

## 2021-02-09 DIAGNOSIS — Z7901 Long term (current) use of anticoagulants: Secondary | ICD-10-CM | POA: Insufficient documentation

## 2021-02-09 DIAGNOSIS — Z79899 Other long term (current) drug therapy: Secondary | ICD-10-CM | POA: Insufficient documentation

## 2021-02-09 DIAGNOSIS — I10 Essential (primary) hypertension: Secondary | ICD-10-CM | POA: Insufficient documentation

## 2021-02-09 DIAGNOSIS — I82411 Acute embolism and thrombosis of right femoral vein: Secondary | ICD-10-CM | POA: Insufficient documentation

## 2021-02-09 MED ORDER — ENOXAPARIN SODIUM 120 MG/0.8ML IJ SOSY: 120.0000 mg | PREFILLED_SYRINGE | Freq: Two times a day (BID) | INTRAMUSCULAR | 4 refills | Status: DC

## 2021-02-09 MED ORDER — WARFARIN SODIUM 5 MG PO TABS
5.0000 mg | ORAL_TABLET | Freq: Every evening | ORAL | 0 refills | Status: DC
  Filled 2021-02-09: qty 30, 30d supply, fill #0

## 2021-02-09 MED ORDER — ENOXAPARIN SODIUM 120 MG/0.8ML IJ SOSY
PREFILLED_SYRINGE | INTRAMUSCULAR | 0 refills | Status: DC
Start: 2021-02-09 — End: 2021-03-06
  Filled 2021-02-09: qty 11.2, 7d supply, fill #0

## 2021-02-09 NOTE — Progress Notes (Signed)
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 male is here because of evaluation of DVT, at the request of the ED.  Today he reports feeling good. He is currently on Lovenox BID; he was previously on Eliquis but reports continued leg swelling and clot formation on Eliquis. He reports his first DVT was 8 years ago; he denies being bedridden prior to the DVT. He was hospitalized for 2 months following this DVT. At that time he was placed on Xarelto, but he had to stop due to losing his insurance. In the time he was unable to get Xarelto due to issues with insurance, he formed another DVT. He reports the leg swelling and recurrent clot formation is interfering with his ability to work as his work requires him to be on his feet for long periods of time. He denies current bleeding. He has never taken Coumadin. He denies history of CVA and TIA. He reports improved SOB. He denies fevers and night sweats. His appetite is good. He reports weight loss over the past 2 weeks, but reports he has stopped eating meat in that time.   He denies family history of lupus anticoagulant and cancer. His maternal grandmother had DVT following a open heart surgery. He denies smoking history and alcohol use. He currently works Statistician.   MEDICAL HISTORY:  Past Medical History:  Diagnosis Date   Hypertension    Pulmonary embolism ([place])     SURGICAL HISTORY: History reviewed. No pertinent surgical history.  SOCIAL HISTORY: Social History   Socioeconomic History   Marital status: Single    Spouse name: Not on file   Number of children: Not on file   Years of education: Not on file   Highest education level: Not on file  Occupational History   Not  on file  Tobacco Use   Smoking status: Never   Smokeless tobacco: Never  Substance and Sexual Activity   Alcohol use: Yes   Drug use: Never   Sexual activity: Not on file  Other Topics Concern   Not on file  Social History Narrative   Not on file   Social Determinants of Health   Financial Resource Strain: Not on file  Food Insecurity: Not on file  Transportation Needs: Not on file  Physical Activity: Not on file  Stress: Not on file  Social Connections: Not on file  Intimate Partner Violence: Not on file    FAMILY HISTORY: Family History  Problem Relation Age of Onset   Healthy Mother    Healthy Father     ALLERGIES:  is allergic to black walnut pollen allergy skin test, other, and lac bovis.  MEDICATIONS:  Current Outpatient Medications  Medication Sig Dispense Refill   amLODipine (NORVASC) 5 MG tablet Take 1 tablet (5 mg total) by mouth daily. 30 tablet 1   acetaminophen (TYLENOL) 325 MG tablet Take 2 tablets (650 mg total) by mouth every 6 (six) hours as needed for mild pain (or Fever >/= 101). (Patient not taking: Reported on 02/09/2021)     enoxaparin (LOVENOX) 120 MG/0.8ML injection Inject 0.8 mLs (120 mg total) into the skin every 12 (twelve) hours. 48 mL 4   No current facility-administered medications for this visit.    REVIEW  OF SYSTEMS:   Review of Systems  Constitutional:  Negative for appetite change, fatigue, fever and unexpected weight change.  HENT:   Negative for nosebleeds.   Respiratory:  Positive for cough. Negative for hemoptysis. Shortness of breath: improved.  Gastrointestinal:  Negative for blood in stool.  Endocrine: Negative for hot flashes.  Genitourinary:  Negative for hematuria.   All other systems reviewed and are negative.   PHYSICAL EXAMINATION: ECOG PERFORMANCE STATUS: 0 - Asymptomatic  Vitals:   02/09/21 0823  BP: (!) 146/97  Pulse: 66  Resp: 18  Temp: (!) 96.7 F (35.9 C)  SpO2: 100%   Filed Weights   02/09/21 0823   Weight: 246 lb 0.5 oz (111.6 kg)   Physical Exam Vitals reviewed.  Constitutional:      Appearance: Normal appearance.  Cardiovascular:     Rate and Rhythm: Normal rate and regular rhythm.     Pulses: Normal pulses.     Heart sounds: Normal heart sounds.  Pulmonary:     Effort: Pulmonary effort is normal.     Breath sounds: Normal breath sounds.  Abdominal:     Palpations: Abdomen is soft. There is no hepatomegaly, splenomegaly or mass.     Tenderness: There is no abdominal tenderness.  Lymphadenopathy:     Upper Body:     Right upper body: No supraclavicular, axillary or pectoral adenopathy.     Left upper body: No supraclavicular, axillary or pectoral adenopathy.  Neurological:     General: No focal deficit present.     Mental Status: He is alert and oriented to person, place, and time.  Psychiatric:        Mood and Affect: Mood normal.        Behavior: Behavior normal.     LABORATORY DATA:  I have reviewed the data as listed Recent Results (from the past 2160 hour(s))  Basic metabolic panel     Status: Abnormal   Collection Time: 01/19/21  4:15 PM  Result Value Ref Range   Sodium 138 135 - 145 mmol/L   Potassium 4.7 3.5 - 5.1 mmol/L   Chloride 104 98 - 111 mmol/L   CO2 23 22 - 32 mmol/L   Glucose, Bld 103 (H) 70 - 99 mg/dL    Comment: Glucose reference range applies only to samples taken after fasting for at least 8 hours.   BUN 14 6 - 20 mg/dL   Creatinine, Ser 1.21 0.61 - 1.24 mg/dL   Calcium 9.1 8.9 - 10.3 mg/dL   GFR, Estimated >60 >60 mL/min    Comment: (NOTE) Calculated using the CKD-EPI Creatinine Equation (2021)    Anion gap 11 5 - 15    Comment: Performed at [facility], [address] [place], [place] [address]  CBC     Status: None   Collection Time: 01/19/21  4:15 PM  Result Value Ref Range   WBC 4.8 4.0 - 10.5 K/uL   RBC 4.74 4.22 - 5.81 MIL/uL   Hemoglobin 13.7 13.0 - 17.0 g/dL   HCT 41.9 39.0 - 52.0 %   MCV 88.4 80.0 - 100.0 fL   MCH 28.9  26.0 - 34.0 pg   MCHC 32.7 30.0 - 36.0 g/dL   RDW 13.5 11.5 - 15.5 %   Platelets 265 150 - 400 K/uL   nRBC 0.0 0.0 - 0.2 %    Comment: Performed at [facility], [address], [place], [place] [address]  Troponin I (High Sensitivity)  Status: Abnormal   Collection Time: 01/19/21  4:15 PM  Result Value Ref Range   Troponin I (High Sensitivity) 18 (H) <18 ng/L    Comment: (NOTE) Elevated high sensitivity troponin I (hsTnI) values and significant  changes across serial measurements may suggest ACS but many other  chronic and acute conditions are known to elevate hsTnI results.  Refer to the "Links" section for chest pain algorithms and additional  guidance. Performed at [facility], [address] [place], [place] [address]   Troponin I (High Sensitivity)     Status: Abnormal   Collection Time: 01/19/21  6:27 PM  Result Value Ref Range   Troponin I (High Sensitivity) 19 (H) <18 ng/L    Comment: (NOTE) Elevated high sensitivity troponin I (hsTnI) values and significant  changes across serial measurements may suggest ACS but many other  chronic and acute conditions are known to elevate hsTnI results.  Refer to the "Links" section for chest pain algorithms and additional  guidance. Performed at [facility], [address], [place], [place] [address]   Resp Panel by RT-PCR (Flu A&B, Covid) Nasopharyngeal Swab     Status: Abnormal   Collection Time: 01/19/21  6:56 PM   Specimen: Nasopharyngeal Swab; Nasopharyngeal(NP) swabs in vial transport medium  Result Value Ref Range   SARS Coronavirus 2 by RT PCR POSITIVE (A) NEGATIVE    Comment: (NOTE) SARS-CoV-2 target nucleic acids are DETECTED.  The SARS-CoV-2 RNA is generally detectable in upper respiratory specimens during the acute phase of infection. Positive results are indicative of the presence of the identified virus, but do not rule out bacterial infection or co-infection with other pathogens not detected by the test.  Clinical correlation with patient history and other diagnostic information is necessary to determine patient infection status. The expected result is Negative.  Fact Sheet for Patients: EntrepreneurPulse.com.au  Fact Sheet for Healthcare Providers: IncredibleEmployment.be  This test is not yet approved or cleared by the [place] FDA and  has been authorized for detection and/or diagnosis of SARS-CoV-2 by FDA under an Emergency Use Authorization (EUA).  This EUA will remain in effect (meaning this test can be used) for the duration of  the COVID-19 declaration under Section 564(b)(1) of the A ct, 21 U.S.C. section 360bbb-3(b)(1), unless the authorization is terminated or revoked sooner.     Influenza A by PCR NEGATIVE NEGATIVE   Influenza B by PCR NEGATIVE NEGATIVE    Comment: (NOTE) The Xpert Xpress SARS-CoV-2/FLU/RSV plus assay is intended as an aid in the diagnosis of influenza from Nasopharyngeal swab specimens and should not be used as a sole basis for treatment. Nasal washings and aspirates are unacceptable for Xpert Xpress SARS-CoV-2/FLU/RSV testing.  Fact Sheet for Patients: EntrepreneurPulse.com.au  Fact Sheet for Healthcare Providers: IncredibleEmployment.be  This test is not yet approved or cleared by the [place] FDA and has been authorized for detection and/or diagnosis of SARS-CoV-2 by FDA under an Emergency Use Authorization (EUA). This EUA will remain in effect (meaning this test can be used) for the duration of the COVID-19 declaration under Section 564(b)(1) of the Act, 21 U.S.C. section 360bbb-3(b)(1), unless the authorization is terminated or revoked.  Performed at [facility], [address] [place], [place] [address]   CBC     Status: None   Collection Time: 01/20/21  4:00 AM  Result Value Ref Range   WBC 5.8 4.0 - 10.5 K/uL   RBC 4.69 4.22 - 5.81 MIL/uL   Hemoglobin  13.5 13.0 -  17.0 g/dL   HCT 40.6 39.0 - 52.0 %   MCV 86.6 80.0 - 100.0 fL   MCH 28.8 26.0 - 34.0 pg   MCHC 33.3 30.0 - 36.0 g/dL   RDW 13.6 11.5 - 15.5 %   Platelets 259 150 - 400 K/uL   nRBC 0.0 0.0 - 0.2 %    Comment: Performed at [facility], [address], [place], [place] [address]  HIV Antibody (routine testing w rflx)     Status: None   Collection Time: 01/20/21  8:07 AM  Result Value Ref Range   HIV Screen 4th Generation wRfx Non Reactive Non Reactive    Comment: Performed at [facility], [place] [address], [place], [place] [address]  Comprehensive metabolic panel     Status: Abnormal   Collection Time: 01/20/21  8:07 AM  Result Value Ref Range   Sodium 133 (L) 135 - 145 mmol/L   Potassium 4.0 3.5 - 5.1 mmol/L   Chloride 100 98 - 111 mmol/L   CO2 22 22 - 32 mmol/L   Glucose, Bld 88 70 - 99 mg/dL    Comment: Glucose reference range applies only to samples taken after fasting for at least 8 hours.   BUN 13 6 - 20 mg/dL   Creatinine, Ser 1.26 (H) 0.61 - 1.24 mg/dL   Calcium 8.7 (L) 8.9 - 10.3 mg/dL   Total Protein 7.9 6.5 - 8.1 g/dL   Albumin 4.0 3.5 - 5.0 g/dL   AST 23 15 - 41 U/L   ALT 30 0 - 44 U/L   Alkaline Phosphatase 74 38 - 126 U/L   Total Bilirubin 0.8 0.3 - 1.2 mg/dL   GFR, Estimated >60 >60 mL/min    Comment: (NOTE) Calculated using the CKD-EPI Creatinine Equation (2021)    Anion gap 11 5 - 15    Comment: Performed at [facility], [address] [place], [place] [address]  Magnesium     Status: None   Collection Time: 01/20/21  8:07 AM  Result Value Ref Range   Magnesium 1.8 1.7 - 2.4 mg/dL    Comment: Performed at [facility], [address], [place], [place] [address]  CBC WITH DIFFERENTIAL     Status: None   Collection Time: 01/20/21  8:07 AM  Result Value Ref Range   WBC 5.4 4.0 - 10.5 K/uL   RBC 4.60 4.22 - 5.81 MIL/uL   Hemoglobin 13.4 13.0 - 17.0 g/dL   HCT 40.5 39.0 - 52.0 %   MCV 88.0 80.0 - 100.0 fL   MCH 29.1 26.0 - 34.0 pg   MCHC  33.1 30.0 - 36.0 g/dL   RDW 13.8 11.5 - 15.5 %   Platelets 258 150 - 400 K/uL   nRBC 0.0 0.0 - 0.2 %   Neutrophils Relative % 61 %   Neutro Abs 3.3 1.7 - 7.7 K/uL   Lymphocytes Relative 26 %   Lymphs Abs 1.4 0.7 - 4.0 K/uL   Monocytes Relative 11 %   Monocytes Absolute 0.6 0.1 - 1.0 K/uL   Eosinophils Relative 0 %   Eosinophils Absolute 0.0 0.0 - 0.5 K/uL   Basophils Relative 1 %   Basophils Absolute 0.0 0.0 - 0.1 K/uL   Immature Granulocytes 1 %   Abs Immature Granulocytes 0.03 0.00 - 0.07 K/uL    Comment: Performed at [facility], [address] [place], [place] [address]  ECHOCARDIOGRAM COMPLETE     Status: None   Collection Time:  01/20/21 10:52 AM  Result Value Ref Range   Weight 4,080 oz   Height 76 in   BP 118/92 mmHg   Single Plane A2C EF 54.0 %   Single Plane A4C EF 38.5 %   Calc EF 44.9 %   AR max vel 2.11 cm2   AV Area VTI 2.01 cm2   AV Mean grad 4.0 mmHg   AV Peak grad 7.5 mmHg   Ao pk vel 1.37 m/s   AV Area mean vel 1.88 cm2   MV VTI 2.86 cm2   Area-P 1/2 4.65 cm2   S' Lateral 3.10 cm   P 1/2 time 670 msec  CBC     Status: Abnormal   Collection Time: 01/21/21  7:07 AM  Result Value Ref Range   WBC 4.6 4.0 - 10.5 K/uL   RBC 4.35 4.22 - 5.81 MIL/uL   Hemoglobin 12.4 (L) 13.0 - 17.0 g/dL   HCT 38.0 (L) 39.0 - 52.0 %   MCV 87.4 80.0 - 100.0 fL   MCH 28.5 26.0 - 34.0 pg   MCHC 32.6 30.0 - 36.0 g/dL   RDW 13.4 11.5 - 15.5 %   Platelets 212 150 - 400 K/uL   nRBC 0.0 0.0 - 0.2 %    Comment: Performed at [facility], [address], [place], [place] [id number]  Basic metabolic panel     Status: Abnormal   Collection Time: 01/21/21  7:07 AM  Result Value Ref Range   Sodium 135 135 - 145 mmol/L   Potassium 3.9 3.5 - 5.1 mmol/L   Chloride 101 98 - 111 mmol/L   CO2 25 22 - 32 mmol/L   Glucose, Bld 96 70 - 99 mg/dL    Comment: Glucose reference range applies only to samples taken after fasting for at least 8 hours.   BUN 9 6 - 20 mg/dL   Creatinine, Ser 1.27  (H) 0.61 - 1.24 mg/dL   Calcium 8.3 (L) 8.9 - 10.3 mg/dL   GFR, Estimated >60 >60 mL/min    Comment: (NOTE) Calculated using the CKD-EPI Creatinine Equation (2021)    Anion gap 9 5 - 15    Comment: Performed at [facility], [address], [place], [place] [address]    RADIOGRAPHIC STUDIES: I have personally reviewed the radiological images as listed and agreed with the findings in the report. DG Chest 2 View  Result Date: 01/19/2021 CLINICAL DATA:  Shortness of breath, cough. EXAM: CHEST - 2 VIEW COMPARISON:  March 16, 2018. FINDINGS: The heart size and mediastinal contours are within normal limits. Both lungs are clear. The visualized skeletal structures are unremarkable. IMPRESSION: No active cardiopulmonary disease. Electronically Signed   By: Marijo Conception M.D.   On: 01/19/2021 16:01   CT Angio Chest PE W and/or Wo Contrast  Result Date: 01/19/2021 CLINICAL DATA:  Pulmonary embolism (PE) suspected, high prob. Pt arrives with c/o exertional SOB that started about a month ago. Pt denies fevers. Pt endorses generalized body aches and cough. history of PE. COVID-19 infection positive. EXAM: CT ANGIOGRAPHY CHEST WITH CONTRAST TECHNIQUE: Multidetector CT imaging of the chest was performed using the standard protocol during bolus administration of intravenous contrast. Multiplanar CT image reconstructions and MIPs were obtained to evaluate the vascular anatomy. CONTRAST:  178mL OMNIPAQUE IOHEXOL 350 MG/ML SOLN COMPARISON:  CT angiography chest 03/16/2018 FINDINGS: Cardiovascular: Satisfactory opacification of the pulmonary arteries to the segmental level. No evidence of pulmonary embolism. Normal heart size. No significant pericardial effusion.  The thoracic aorta is normal in caliber. No atherosclerotic plaque of the thoracic aorta. No definite coronary artery calcifications. Mediastinum/Nodes: No enlarged mediastinal, hilar, or axillary lymph nodes. Thyroid gland, trachea, and esophagus  demonstrate no significant findings. Lungs/Pleura: Right apical paraseptal emphysematous changes. Right apical pleural/pulmonary scarring. Cannot excluded developing pulmonary infarction within the lingula (6:70). There is a 2.2 x 1.4 cm ground-glass peripheral left upper lobe airspace opacity. No pulmonary mass. No pleural effusion. No pneumothorax. Upper Abdomen: No acute abnormality. Musculoskeletal: No chest wall abnormality. No suspicious lytic or blastic osseous lesions. No acute displaced fracture. Multilevel degenerative changes of the spine. Review of the MIP images confirms the above findings. IMPRESSION: 1. Distal central left pulmonary embolus as well as all five pulmonary lobes segmental and subsegmental pulmonary emboli. No associated right heart strain. Cannot excluded developing pulmonary infarction within the lingula. 2. Indeterminate 2.2 x 1.4 cm ground-glass peripheral left upper lobe airspace opacity. Recommend short-term repeat CT in 3 months once COVID-19 infection is treated to evaluate for stability. Non-contrast chest CT at 3-6 months is recommended. If nodules persist, subsequent management will be based upon the most suspicious nodule(s). This recommendation follows the consensus statement: Guidelines for Management of Incidental Pulmonary Nodules Detected on CT Images: From the Fleischner Society 2017; Radiology 2017; 284:228-243. 3.  Emphysema (ICD10-J43.9). These results were called by telephone at the time of interpretation on 01/19/2021 at 9:30 pm to provider ABIGAIL HARRIS , who verbally acknowledged these results. Electronically Signed   By: Iven Finn M.D.   On: 01/19/2021 21:36   US Venous Img Lower Bilateral (DVT)  Result Date: 01/20/2021 CLINICAL DATA:  Recent diagnosis of pulmonary embolism. Evaluate for DVT. EXAM: BILATERAL LOWER EXTREMITY VENOUS DOPPLER ULTRASOUND TECHNIQUE: Gray-scale sonography with graded compression, as well as color Doppler and duplex ultrasound  were performed to evaluate the lower extremity deep venous systems from the level of the common femoral vein and including the common femoral, femoral, profunda femoral, popliteal and calf veins including the posterior tibial, peroneal and gastrocnemius veins when visible. The superficial great saphenous vein was also interrogated. Spectral Doppler was utilized to evaluate flow at rest and with distal augmentation maneuvers in the common femoral, femoral and popliteal veins. COMPARISON:  None. FINDINGS: RIGHT LOWER EXTREMITY Common Femoral Vein: No evidence of thrombus. Normal compressibility, respiratory phasicity and response to augmentation. Saphenofemoral Junction: No evidence of thrombus. Normal compressibility and flow on color Doppler imaging. Profunda Femoral Vein: No evidence of thrombus. Normal compressibility and flow on color Doppler imaging. There is mixed echogenic occlusive thrombus involving the proximal (image 10), mid (image 17) and distal (image 21) aspects of the right femoral vein, extending to involve the right popliteal vein (image 23) as well as the imaged portions of the right posterior tibial and peroneal veins (image 38). Superficial Great Saphenous Vein: No evidence of thrombus. Normal compressibility. Other Findings: There is hypoechoic occlusive thrombus involving the right lesser saphenous vein (image 27). LEFT LOWER EXTREMITY There is hypoechoic occlusive thrombus involving the left common femoral vein (image 48), extending to involve the saphenofemoral junction (image 50) as well as the imaged portions of the left deep femoral vein (image 54). There is hypoechoic occlusive thrombus involving the proximal (image 55), mid (image 59) and distal (image 63) aspects of the femoral vein, extending to involve the left popliteal vein (image 67) as well as the imaged aspects of the left tibial veins. Superficial Great Saphenous Vein: No evidence of thrombus. Normal compressibility. Other  Findings: There  is near occlusive thrombus involving the left lesser saphenous vein (image 71). IMPRESSION: 1. The examination is positive for extensive predominantly occlusive DVT extending from the proximal aspect of the right femoral vein through the imaged right tibial veins. 2. Examination is positive for extensive predominantly occlusive DVT extending from the left common femoral vein through the imaged left tibial veins. 3. Examination is positive for occlusive/near occlusive superficial thrombophlebitis involving the bilateral lesser saphenous veins. Electronically Signed   By: Simonne Come M.D.   On: 01/20/2021 10:10   ECHOCARDIOGRAM COMPLETE  Result Date: 01/20/2021    ECHOCARDIOGRAM REPORT   Patient Name:   Keith Norman Date of Exam: 01/20/2021 Medical Rec #:  [id number]         Height:       76.0 in Accession #:    [id number]        Weight:       255.0 lb Date of Birth:  1980/11/14         BSA:          2.456 m Patient Age:    40 years          BP:           118/92 mmHg Patient Gender: M                 HR:           95 bpm. [facility] Procedure: 2D Echo, Cardiac Doppler and Color Doppler Indications:    Pulmonary embolus  History:        Patient has prior history of Echocardiogram examinations, most                 recent 03/17/2018. Signs/Symptoms:Chest Pain; Risk                 Factors:Hypertension.  Sonographer:    Mikki Harbor Referring Phys: [id number] ASIA B ZIERLE-GHOSH IMPRESSIONS  1. Left ventricular ejection fraction, by estimation, is 50%. The left ventricle has low normal function. Left ventricular endocardial border not optimally defined to evaluate regional wall motion. There is moderate left ventricular hypertrophy. Left ventricular diastolic parameters are indeterminate.  2. Right ventricular systolic function is normal. The right ventricular size is mildly enlarged. Tricuspid regurgitation signal is inadequate for assessing PA pressure.  3. The mitral valve is normal  in structure. No evidence of mitral valve regurgitation. No evidence of mitral stenosis.  4. The tricuspid valve is abnormal.  5. The aortic valve is tricuspid. There is moderate calcification of the aortic valve. There is moderate thickening of the aortic valve. Aortic valve regurgitation is mild to moderate.  6. The inferior vena cava is normal in size with greater than 50% respiratory variability, suggesting right atrial pressure of 3 mmHg. FINDINGS  Left Ventricle: Left ventricular ejection fraction, by estimation, is 50%. The left ventricle has low normal function. Left ventricular endocardial border not optimally defined to evaluate regional wall motion. The left ventricular internal cavity size was normal in size. There is moderate left ventricular hypertrophy. Left ventricular diastolic parameters are indeterminate. Right Ventricle: The right ventricular size is mildly enlarged. No increase in right ventricular wall thickness. Right ventricular systolic function is normal. Tricuspid regurgitation signal is inadequate for assessing PA pressure. Left Atrium: Left atrial size was normal in size. Right Atrium: Right atrial size was normal in size. Pericardium: There is no evidence of pericardial effusion. Mitral Valve: The mitral valve is normal in structure. No evidence  of mitral valve regurgitation. No evidence of mitral valve stenosis. MV peak gradient, 6.7 mmHg. The mean mitral valve gradient is 2.0 mmHg. Tricuspid Valve: The tricuspid valve is abnormal. Tricuspid valve regurgitation is mild . No evidence of tricuspid stenosis. Aortic Valve: The aortic valve is tricuspid. There is moderate calcification of the aortic valve. There is moderate thickening of the aortic valve. There is moderate aortic valve annular calcification. Aortic valve regurgitation is mild to moderate. Aortic regurgitation PHT measures 670 msec. Aortic valve mean gradient measures 4.0 mmHg. Aortic valve peak gradient measures 7.5 mmHg.  Aortic valve area, by VTI measures 2.01 cm. Pulmonic Valve: The pulmonic valve was not well visualized. Pulmonic valve regurgitation is mild. No evidence of pulmonic stenosis. Aorta: The aortic root is normal in size and structure. Venous: The inferior vena cava is normal in size with greater than 50% respiratory variability, suggesting right atrial pressure of 3 mmHg. IAS/Shunts: No atrial level shunt detected by color flow Doppler.  LEFT VENTRICLE PLAX 2D LVIDd:         4.70 cm      Diastology LVIDs:         3.10 cm      LV e' medial:   4.24 cm/s LV PW:         1.50 cm      LV E/e' medial: 9.2 LV IVS:        1.30 cm LVOT diam:     2.10 cm LV SV:         49 LV SV Index:   20 LVOT Area:     3.46 cm  LV Volumes (MOD) LV vol d, MOD A2C: 92.3 ml LV vol d, MOD A4C: 101.0 ml LV vol s, MOD A2C: 42.5 ml LV vol s, MOD A4C: 62.1 ml LV SV MOD A2C:     49.8 ml LV SV MOD A4C:     101.0 ml LV SV MOD BP:      43.9 ml RIGHT VENTRICLE RV Basal diam:  3.75 cm RV Mid diam:    3.70 cm RV S prime:     10.30 cm/s TAPSE (M-mode): 1.9 cm LEFT ATRIUM             Index        RIGHT ATRIUM           Index LA diam:        2.80 cm 1.14 cm/m   RA Area:     18.10 cm LA Vol (A2C):   42.3 ml 17.22 ml/m  RA Volume:   50.90 ml  20.72 ml/m LA Vol (A4C):   35.1 ml 14.29 ml/m LA Biplane Vol: 38.6 ml 15.72 ml/m  AORTIC VALVE                    PULMONIC VALVE AV Area (Vmax):    2.11 cm     PV Vmax:       0.66 m/s AV Area (Vmean):   1.88 cm     PV Peak grad:  1.7 mmHg AV Area (VTI):     2.01 cm AV Vmax:           137.00 cm/s AV Vmean:          96.900 cm/s AV VTI:            0.245 m AV Peak Grad:      7.5 mmHg AV Mean Grad:      4.0 mmHg LVOT Vmax:  83.60 cm/s LVOT Vmean:        52.500 cm/s LVOT VTI:          0.142 m LVOT/AV VTI ratio: 0.58 AI PHT:            670 msec  AORTA Ao Root diam: 3.60 cm Ao Asc diam:  3.70 cm MITRAL VALVE MV Area (PHT): 4.65 cm    SHUNTS MV Area VTI:   2.86 cm    Systemic VTI:  0.14 m MV Peak grad:  6.7 mmHg     Systemic Diam: 2.10 cm MV Mean grad:  2.0 mmHg MV Vmax:       1.29 m/s MV Vmean:      55.2 cm/s MV Decel Time: 163 msec MV E velocity: 38.90 cm/s MV A velocity: 57.30 cm/s MV E/A ratio:  0.68 Carlyle Dolly MD Electronically signed by Carlyle Dolly MD Signature Date/Time: 01/20/2021/1:28:11 PM    Final     ASSESSMENT:  Recurrent unprovoked DVT and pulmonary embolism: - Unprovoked PE with infarction in 2014, 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 - He moved to [place] and was out of Xarelto for few weeks, subsequently started on Eliquis, diagnosed with extensive left lower extremity DVT on 07/15/2019 when he was started on Eliquis for 5 days prior (apparently he was taking 10 mg once daily) - He was started on Eliquis loading dose followed by 5 mg twice daily dosing. - He reports he was taking Eliquis without fail.  He presented to the hospital on 01/19/2021 with shortness of breath and was found to have distal central left pulmonary embolus as well as 5 pulmonary lobes segmental and subsegmental PE.  No associated right heart strain. - Ultrasound of the lower extremities on 01/20/2021 showed extensive DVT involving the right and left femoral veins through the imaged right and left tibial veins. - Patient was deemed a resistant to Eliquis, and was started on Lovenox 120 mg every 12 hours. - All of these episodes are unprovoked.  I have reviewed records from [facility] which showed previous testing for prothrombin gene mutation, factor V Leiden, lupus anticoagulant, anticardiolipin antibodies, antibeta-2 glycoprotein 1 antibodies were negative.   Social/family history: - He lives at home with his wife.  He works as a Archivist. - He is non-smoker and nonalcoholic. - Maternal grandmother had DVT after CABG at age 59.  No family history of lupus anticoagulant.  No family history of malignancies.   PLAN:  Recurrent unprovoked DVT and PE: - We discussed the need for indefinite anticoagulation given his personal  history of multiple episodes of unprovoked thromboembolism. - He does not have insurance at this time.  We will try to obtain Lovenox at a discounted price.  If it is not feasible, I have recommended switching him to Coumadin along with the bridging Lovenox until INR becomes therapeutic. - He reported that he would get insurance through his wife starting February 1 of this year.  If there is any difficulty with Coumadin, will place him back on Lovenox twice daily dosing. - We will reevaluate him in 4 months with a D-dimer.  If D-dimer is normal, will consider switching him to either once a day Lovenox (1.5 mg/kg) or consider restarting him on Xarelto as he did not have any clots for 6 years while he was on it.   All questions were answered. The patient knows to call the clinic with any problems, questions or concerns.  Derek Jack, MD 02/09/21 8:55 AM  [facility]  [place] [phone]   I, Thana Ates, am acting as a scribe for Dr. Derek Jack.  I, Derek Jack MD, have reviewed the above documentation for accuracy and completeness, and I agree with the above.

## 2021-02-09 NOTE — Telephone Encounter (Signed)
Pt unable to pay for Lovenox. [facility] will cover the expenses. Pt is aware that this is a one time benefit for him. RX will be e scribed to [employer]

## 2021-02-09 NOTE — Patient Instructions (Addendum)
[place] at [facility] Discharge Instructions   You were seen and examined today by Dr. Delton Coombes.  There is no further blood work we need to test since you have had extensive work up of this in the past.   Continue Lovenox injections as prescribed. We sent a refill to your pharmacy [facility]) with 4 extra refills.   Return as scheduled.    Thank you for choosing [place] at [facility] to provide your oncology and hematology care.  To afford each patient quality time with our provider, please arrive at least 15 minutes before your scheduled appointment time.   If you have a lab appointment with the [place] please come in thru the Main Entrance and check in at the main information desk.  You need to re-schedule your appointment should you arrive 10 or more minutes late.  We strive to give you quality time with our providers, and arriving late affects you and other patients whose appointments are after yours.  Also, if you no show three or more times for appointments you may be dismissed from the clinic at the providers discretion.     Again, thank you for choosing [facility].  Our hope is that these requests will decrease the amount of time that you wait before being seen by our physicians.       _____________________________________________________________  Should you have questions after your visit to [facility], please contact our office at [phone] and follow the prompts.  Our office hours are 8:00 a.m. and 4:30 p.m. Monday - Friday.  Please note that voicemails left after 4:00 p.m. may not be returned until the following business day.  We are closed weekends and major holidays.  You do have access to a nurse 24-7, just call the main number to the clinic [phone] and do not press any options, hold on the line and a nurse will answer the phone.    For prescription refill requests, have your  pharmacy contact our office and allow 72 hours.    Due to Covid, you will need to wear a mask upon entering the hospital. If you do not have a mask, a mask will be given to you at the Main Entrance upon arrival. For doctor visits, patients may have 1 support person age 30 or older with them. For treatment visits, patients can not have anyone with them due to social distancing guidelines and our immunocompromised population.

## 2021-02-10 ENCOUNTER — Other Ambulatory Visit (HOSPITAL_COMMUNITY): Payer: Self-pay

## 2021-02-11 ENCOUNTER — Other Ambulatory Visit: Payer: Self-pay

## 2021-02-11 ENCOUNTER — Inpatient Hospital Stay (HOSPITAL_COMMUNITY): Payer: Self-pay

## 2021-02-11 VITALS — BP 120/91 | HR 67 | Temp 97.0°F | Resp 18

## 2021-02-11 DIAGNOSIS — I2699 Other pulmonary embolism without acute cor pulmonale: Secondary | ICD-10-CM

## 2021-02-11 MED ORDER — ENOXAPARIN SODIUM 100 MG/ML IJ SOSY
100.0000 mg | PREFILLED_SYRINGE | Freq: Once | INTRAMUSCULAR | Status: AC
  Administered 2021-02-11: 100 mg via SUBCUTANEOUS
  Filled 2021-02-11: qty 1

## 2021-02-11 MED ORDER — ENOXAPARIN SODIUM 80 MG/0.8ML IJ SOSY
80.0000 mg | PREFILLED_SYRINGE | Freq: Once | INTRAMUSCULAR | Status: AC
  Administered 2021-02-11: 80 mg via SUBCUTANEOUS
  Filled 2021-02-11: qty 0.8

## 2021-02-11 NOTE — Progress Notes (Signed)
Dr Ellin Saba has ordered Lovenox 1.5 mg/kg ~ 180 mg that he wants patient to receive today.  T.O. Dr Carilyn Goodpasture, PharmD

## 2021-02-11 NOTE — Patient Instructions (Signed)
[facility]  Discharge Instructions: Thank you for choosing [facility] to provide your oncology and hematology care.  If you have a lab appointment with the Cancer Center, please come in thru the Main Entrance and check in at the main information desk.  Wear comfortable clothing and clothing appropriate for easy access to any Portacath or PICC line.   We strive to give you quality time with your provider. You may need to reschedule your appointment if you arrive late (15 or more minutes).  Arriving late affects you and other patients whose appointments are after yours.  Also, if you miss three or more appointments without notifying the office, you may be dismissed from the clinic at the provider's discretion.      For prescription refill requests, have your pharmacy contact our office and allow 72 hours for refills to be completed.        To help prevent nausea and vomiting after your treatment, we encourage you to take your nausea medication as directed.  BELOW ARE SYMPTOMS THAT SHOULD BE REPORTED IMMEDIATELY: *FEVER GREATER THAN 100.4 F (38 C) OR HIGHER *CHILLS OR SWEATING *NAUSEA AND VOMITING THAT IS NOT CONTROLLED WITH YOUR NAUSEA MEDICATION *UNUSUAL SHORTNESS OF BREATH *UNUSUAL BRUISING OR BLEEDING *URINARY PROBLEMS (pain or burning when urinating, or frequent urination) *BOWEL PROBLEMS (unusual diarrhea, constipation, pain near the anus) TENDERNESS IN MOUTH AND THROAT WITH OR WITHOUT PRESENCE OF ULCERS (sore throat, sores in mouth, or a toothache) UNUSUAL RASH, SWELLING OR PAIN  UNUSUAL VAGINAL DISCHARGE OR ITCHING   Items with * indicate a potential emergency and should be followed up as soon as possible or go to the Emergency Department if any problems should occur.  Please show the CHEMOTHERAPY ALERT CARD or IMMUNOTHERAPY ALERT CARD at check-in to the Emergency Department and triage nurse.  Should you have questions after your visit or need to cancel  or reschedule your appointment, please contact [facility] [phone]  and follow the prompts.  Office hours are 8:00 a.m. to 4:30 p.m. Monday - Friday. Please note that voicemails left after 4:00 p.m. may not be returned until the following business day.  We are closed weekends and major holidays. You have access to a nurse at all times for urgent questions. Please call the main number to the clinic [phone] and follow the prompts.  For any non-urgent questions, you may also contact your provider using MyChart. We now offer e-Visits for anyone 18 and older to request care online for non-urgent symptoms. For details visit [email or web address].   Also download the MyChart app! Go to the app store, search "MyChart", open the app, select [place], and log in with your MyChart username and password.  Due to Covid, a mask is required upon entering the hospital/clinic. If you do not have a mask, one will be given to you upon arrival. For doctor visits, patients may have 1 support person aged 18 or older with them. For treatment visits, patients cannot have anyone with them due to current Covid guidelines and our immunocompromised population.  

## 2021-02-11 NOTE — Progress Notes (Signed)
Patient presents today for Lovenox injection.  Patient is in satisfactory condition with no complaints voiced.  Vital signs are stable.  We will proceed with treatment per MD orders.   Patient tolerated injections with no complaints voiced.  Site clean and dry with no bruising or swelling noted.  No complaints of pain.  Discharged ambulatory with vital signs stable and no signs or symptoms of distress noted.

## 2021-02-15 ENCOUNTER — Inpatient Hospital Stay (HOSPITAL_COMMUNITY): Payer: Self-pay

## 2021-02-15 ENCOUNTER — Other Ambulatory Visit: Payer: Self-pay

## 2021-02-15 DIAGNOSIS — I2699 Other pulmonary embolism without acute cor pulmonale: Secondary | ICD-10-CM

## 2021-02-15 LAB — PROTIME-INR
INR: 1 (ref 0.8–1.2)
Prothrombin Time: 13.6 seconds (ref 11.4–15.2)

## 2021-02-28 ENCOUNTER — Encounter (HOSPITAL_COMMUNITY): Payer: Self-pay | Admitting: Emergency Medicine

## 2021-02-28 NOTE — Progress Notes (Signed)
PA started by [facility] pharmacy in [place].  This prescription was sent in 02/09/2021, it was filled at [facility] outpt pharmacy and covered by [place].  [facility] notified and prescription put on hold.

## 2021-03-06 ENCOUNTER — Encounter (HOSPITAL_COMMUNITY): Payer: Self-pay

## 2021-03-06 ENCOUNTER — Emergency Department (HOSPITAL_COMMUNITY)
Admission: EM | Admit: 2021-03-06 | Discharge: 2021-03-06 | Disposition: A | Discharge status: Home / Self Care | Payer: Self-pay | Attending: Emergency Medicine | Admitting: Emergency Medicine

## 2021-03-06 ENCOUNTER — Other Ambulatory Visit: Payer: Self-pay

## 2021-03-06 ENCOUNTER — Emergency Department (HOSPITAL_COMMUNITY): Payer: Self-pay

## 2021-03-06 DIAGNOSIS — I82432 Acute embolism and thrombosis of left popliteal vein: Secondary | ICD-10-CM | POA: Insufficient documentation

## 2021-03-06 DIAGNOSIS — Z7901 Long term (current) use of anticoagulants: Secondary | ICD-10-CM | POA: Insufficient documentation

## 2021-03-06 DIAGNOSIS — I82409 Acute embolism and thrombosis of unspecified deep veins of unspecified lower extremity: Secondary | ICD-10-CM

## 2021-03-06 DIAGNOSIS — I82431 Acute embolism and thrombosis of right popliteal vein: Secondary | ICD-10-CM | POA: Insufficient documentation

## 2021-03-06 DIAGNOSIS — I82412 Acute embolism and thrombosis of left femoral vein: Secondary | ICD-10-CM | POA: Insufficient documentation

## 2021-03-06 DIAGNOSIS — Z79899 Other long term (current) drug therapy: Secondary | ICD-10-CM | POA: Insufficient documentation

## 2021-03-06 DIAGNOSIS — I82411 Acute embolism and thrombosis of right femoral vein: Secondary | ICD-10-CM | POA: Insufficient documentation

## 2021-03-06 LAB — CBC WITH DIFFERENTIAL/PLATELET
Abs Immature Granulocytes: 0.02 10*3/uL (ref 0.00–0.07)
Basophils Absolute: 0 10*3/uL (ref 0.0–0.1)
Basophils Relative: 0 %
Eosinophils Absolute: 0.1 10*3/uL (ref 0.0–0.5)
Eosinophils Relative: 2 %
HCT: 45 % (ref 39.0–52.0)
Hemoglobin: 14.5 g/dL (ref 13.0–17.0)
Immature Granulocytes: 0 %
Lymphocytes Relative: 27 %
Lymphs Abs: 2 10*3/uL (ref 0.7–4.0)
MCH: 27.8 pg (ref 26.0–34.0)
MCHC: 32.2 g/dL (ref 30.0–36.0)
MCV: 86.2 fL (ref 80.0–100.0)
Monocytes Absolute: 0.7 10*3/uL (ref 0.1–1.0)
Monocytes Relative: 9 %
Neutro Abs: 4.6 10*3/uL (ref 1.7–7.7)
Neutrophils Relative %: 62 %
Platelets: 247 10*3/uL (ref 150–400)
RBC: 5.22 MIL/uL (ref 4.22–5.81)
RDW: 14.1 % (ref 11.5–15.5)
WBC: 7.4 10*3/uL (ref 4.0–10.5)
nRBC: 0 % (ref 0.0–0.2)

## 2021-03-06 LAB — BASIC METABOLIC PANEL
Anion gap: 8 (ref 5–15)
BUN: 19 mg/dL (ref 6–20)
CO2: 24 mmol/L (ref 22–32)
Calcium: 9.2 mg/dL (ref 8.9–10.3)
Chloride: 103 mmol/L (ref 98–111)
Creatinine, Ser: 0.95 mg/dL (ref 0.61–1.24)
GFR, Estimated: >60 mL/min (ref >60)
Glucose, Bld: 123 mg/dL — ABNORMAL HIGH (ref 70–99)
Potassium: 4.5 mmol/L (ref 3.5–5.1)
Sodium: 135 mmol/L (ref 135–145)

## 2021-03-06 LAB — PROTIME-INR
INR: 1.2 (ref 0.8–1.2)
Prothrombin Time: 15 seconds (ref 11.4–15.2)

## 2021-03-06 MED ORDER — ENOXAPARIN SODIUM 120 MG/0.8ML IJ SOSY
PREFILLED_SYRINGE | INTRAMUSCULAR | 0 refills | Status: DC
Start: 2021-03-06 — End: 2021-04-12

## 2021-03-06 MED ORDER — WARFARIN SODIUM 5 MG PO TABS
7.5000 mg | ORAL_TABLET | Freq: Every evening | ORAL | 0 refills | Status: DC
Start: 2021-03-06 — End: 2021-03-18

## 2021-03-06 MED ORDER — WARFARIN SODIUM 5 MG PO TABS
7.0000 mg | ORAL_TABLET | Freq: Once | ORAL | Status: AC
  Administered 2021-03-06: 7 mg via ORAL
  Filled 2021-03-06: qty 1

## 2021-03-06 MED ORDER — WARFARIN SODIUM 5 MG PO TABS: 7.5000 mg | ORAL_TABLET | Freq: Every evening | ORAL | 0 refills | Status: DC

## 2021-03-06 MED ORDER — WARFARIN - PHYSICIAN DOSING INPATIENT: Freq: Every day | Status: DC

## 2021-03-06 MED ORDER — ENOXAPARIN SODIUM 120 MG/0.8ML IJ SOSY
120.0000 mg | PREFILLED_SYRINGE | Freq: Once | INTRAMUSCULAR | Status: AC
  Administered 2021-03-06: 120 mg via SUBCUTANEOUS
  Filled 2021-03-06: qty 0.8

## 2021-03-06 MED ORDER — ENOXAPARIN SODIUM 120 MG/0.8ML IJ SOSY
PREFILLED_SYRINGE | INTRAMUSCULAR | 0 refills | Status: DC
Start: 2021-03-06 — End: 2021-03-06

## 2021-03-06 NOTE — Discharge Instructions (Addendum)
Your Coumadin level was too low.  You will need to increase from 5 mg daily to 7.5 mg daily.  You will need to follow-up with your oncologist to check your Coumadin level again within 1 week.  Take Lovenox as well as Coumadin for the first 5 days, then you may continue Coumadin alone.  Call your oncologist within this week to schedule an appointment for follow-up.  Return back to the ER if you have difficulty breathing chest pain or any additional concerns.        MATCH Medication Assistance Card Name:  Keith Norman ID: [id number] Bin: [id number] RX Group: [id number] Discharge Date:  03/06/2021 Expiration Date:  03/16/2021  Dear __________[name]__________  Keith Norman have been approved to have the prescriptions written by your discharging physician filled through our [facility] (Medication Assistance Through [facility]) program. This program allows for a one-time (no refills) 34-day supply of selected medications for a low copay amount.  The copay is $3.00 per prescription. For instance, if you have one prescription, you will pay $3.00; for two prescriptions, you pay $6.00; for three prescriptions, you pay $9.00; and so on.  Only certain pharmacies are participating in this program with [facility]. You will need to select one of the pharmacies from the attached list and take your prescriptions, this letter, and your photo ID to one of the participating pharmacies.   We are excited that you are able to use the [facility] program to get your medications. These prescriptions must be filled within 7 days of hospital discharge or they will no longer be valid for the [facility] program. Should you have any problems with your prescriptions please contact your case management team member at [phone] for [facility].  Thank you, Keith Crease DNP, MSN, RN [phone]   [facility]    [place] - Locations  [facility]        [address], [place], [place]  [facility]    [address] [place], [place], [place]   [facility]        [address], [place],   [facility]       [address] [place], [place], [place]  Other [facility] [address], [place], [place]  [facility]                                                                  [address], [place], [place]  [facility]          [address], [place], [place]  [facility] [address] [place], [place]   [address] [place], [place], [place]  [address], [place], [place]  [address], [place], [place]   [address] [place], [place]   [address] [place], [place]   [address], [place], [place] [address], [place], [place] [address], [place], [place]    [facility] [address], [place], [place]  [address] [place] [address] [place]  [address], [place], [place] [address]  [place], [place] [address], [place], [place] [address], [place], [place]  [address], [place], [place] [address] [place], [place], [place] [address], [place], [place] [address], [place], [place]   [address], [place], [place] [address], [place], [place] [address], [place], [place] [address] [place], [place]   [address], [place], [place]   [address] [place], [place] [address] [place] [address] [place], [place], [place]  [address], [place], [place]  [facility] [address] [place], [place], [place] [address] [place], [place], [place] [address], [place], [place] [address] [place], [place] [address], [place], [place]  [address], [place] [address], [place], [place] [address] [place] [address] [place], [place], [place] [address], [place], [place] [address] [place], [place] [address] [place] [address] [place], [place], [place] [address], [place], [place] (effective 07/16/16) [address], [place], [place] (effective 07/16/16)  (must be filled within 7 days of discharge)

## 2021-03-06 NOTE — ED Provider Notes (Signed)
[facility] EMERGENCY DEPARTMENT Provider Note   CSN: [id number] Arrival date & time: 03/06/21  1045     History  Chief Complaint  Patient presents with   Leg Pain    Keith Norman is a 41 y.o. male.  Patient presents with increased discomfort in the right thigh.  He states that he has a history of multiple DVTs and pulm embolism.  He is not worked up by oncologist in the past without a clear cause for his recurrent blood clots.  He was recently placed on Lovenox and Coumadin.  He has been continuing Coumadin 5 mg daily without missing doses, however he ran out of Lovenox 5 days ago.  Denies chest pain or shortness of breath.      Home Medications Prior to Admission medications   Medication Sig Start Date End Date Taking? Authorizing Provider  acetaminophen (TYLENOL) 325 MG tablet Take 2 tablets (650 mg total) by mouth every 6 (six) hours as needed for mild pain (or Fever >/= 101). 01/21/21   Johnson, Clanford L, MD  amLODipine (NORVASC) 5 MG tablet Take 1 tablet (5 mg total) by mouth daily. 01/21/21   Laural Benes, Clanford L, MD  enoxaparin (LOVENOX) 120 MG/0.8ML injection Inject 1 syringe into the skin twice a day 03/06/21   Cheryll Cockayne, MD  warfarin (COUMADIN) 5 MG tablet Take 1.5 tablets (7.5 mg total) by mouth every evening. 03/06/21   Cheryll Cockayne, MD      Allergies    Black walnut pollen allergy skin test, Other, and Lac bovis    Review of Systems   Review of Systems  Constitutional:  Negative for fever.  HENT:  Negative for ear pain and sore throat.   Eyes:  Negative for pain.  Respiratory:  Negative for cough.   Cardiovascular:  Negative for chest pain.  Gastrointestinal:  Negative for abdominal pain.  Genitourinary:  Negative for flank pain.  Musculoskeletal:  Negative for back pain.  Skin:  Negative for color change and rash.  Neurological:  Negative for syncope.  All other systems reviewed and are negative.  Physical Exam Updated Vital Signs BP 127/83     Pulse 86    Temp 97.7 F (36.5 C) (Oral)    Resp 16    Ht 6\' 4"  (1.93 m)    Wt 117.9 kg    SpO2 98%    BMI 31.65 kg/m  Physical Exam Constitutional:      Appearance: He is well-developed.  HENT:     Head: Normocephalic.     Nose: Nose normal.  Eyes:     Extraocular Movements: Extraocular movements intact.  Cardiovascular:     Rate and Rhythm: Normal rate.  Pulmonary:     Effort: Pulmonary effort is normal.  Musculoskeletal:     Right lower leg: No edema.     Left lower leg: No edema.     Comments: Bilateral lower extremities are soft well perfused no compartment syndrome, neurovascularly intact.  Skin:    Coloration: Skin is not jaundiced.  Neurological:     Mental Status: He is alert. Mental status is at baseline.    ED Results / Procedures / Treatments   Labs (all labs ordered are listed, but only abnormal results are displayed) Labs Reviewed  BASIC METABOLIC PANEL - Abnormal; Notable for the following components:      Result Value   Glucose, Bld 123 (*)    All other components within normal limits  CBC WITH DIFFERENTIAL/PLATELET  PROTIME-INR  EKG None  Radiology US Venous Img Lower Bilateral (DVT)  Result Date: 03/06/2021 CLINICAL DATA:  Known bilateral DVT.  Right thigh pain. EXAM: BILATERAL LOWER EXTREMITY VENOUS DOPPLER ULTRASOUND BILATERAL LOWER EXTREMITY VENOUS DOPPLER ULTRASOUND TECHNIQUE: Gray-scale sonography with graded compression, as well as color Doppler and duplex ultrasound were performed to evaluate the lower extremity deep venous systems from the level of the common femoral vein and including the common femoral, femoral, profunda femoral, popliteal and calf veins including the posterior tibial, peroneal and gastrocnemius veins when visible. The superficial great saphenous vein was also interrogated. Spectral Doppler was utilized to evaluate flow at rest and with distal augmentation maneuvers in the common femoral, femoral and popliteal veins.  COMPARISON:  01/20/2021 FINDINGS: RIGHT LOWER EXTREMITY Common Femoral Vein: No evidence of thrombus. Normal compressibility, respiratory phasicity and response to augmentation. Saphenofemoral Junction: No evidence of thrombus. Normal compressibility and flow on color Doppler imaging. Profunda Femoral Vein: No evidence of thrombus. Normal compressibility and flow on color Doppler imaging. Femoral Vein: Similar to prior study, there is echogenic thrombus within the femoral vein from the mid segment of the distal segment on 2D grayscale with lack of compressibility. No color flow signal identified in the proximal or mid segment. Popliteal Vein: Positive for thrombus on 2D grayscale imaging with no compressibility. Calf Veins: Not well seen LEFT LOWER EXTREMITY Common Femoral Vein: Echogenic thrombus noted on 2D grayscale imaging with lack of compressibility. Saphenofemoral Junction: Positive for thrombus. Profunda Femoral Vein: Positive for thrombus. Femoral Vein: Positive for thrombus. Popliteal Vein: Positive for thrombus Calf Veins: Not well visualized. IMPRESSION: 1. Study remains positive for bilateral largely occlusive DVT extending from the right proximal femoral vein to the popliteal vein and from the left common femoral vein to the popliteal vein. Calf veins are not clearly demonstrated on today's study. This distribution of clot is similar to the 01/20/2021 exam. Critical Value/emergent results were called by telephone at the time of interpretation on 03/06/2021 at 1:10 pm to provider [facility] , who verbally acknowledged these results. Electronically Signed   By: Kennith Center M.D.   On: 03/06/2021 13:11    Procedures Procedures    Medications Ordered in ED Medications  warfarin (COUMADIN) tablet 7 mg (has no administration in time range)  Warfarin - Physician Dosing Inpatient (has no administration in time range)  enoxaparin (LOVENOX) injection 120 mg (120 mg Subcutaneous Given 03/06/21 1335)     ED Course/ Medical Decision Making/ A&P                           Medical Decision Making Amount and/or Complexity of Data Reviewed Labs: ordered. ECG/medicine tests: ordered.  Risk Prescription drug management.   Repeat ultrasound shows bilateral lower extremity DVTs unchanged from prior exam.  Patient's Coumadin appears subtherapeutic.  Advised him to increase dosage to 7.5 mg daily and to check his level again in a week.  Given Lovenox shot here, advised continued Lovenox bridge for 5 days.  Patient advised to follow-up with his oncologist this week.  Advised return if he has difficulty breathing or chest pain or other concerns return to [facility] back to the ER.        Final Clinical Impression(s) / ED Diagnoses Final diagnoses:  DVT (deep venous thrombosis) (HCC)    Rx / DC Orders ED Discharge Orders          Ordered    enoxaparin (LOVENOX) 120 MG/0.8ML injection  Note to Pharmacy: Please provide 10 syringes   03/06/21 1358    warfarin (COUMADIN) 5 MG tablet  Every evening        03/06/21 1358              Cheryll Cockayne, MD 03/06/21 [phone]

## 2021-03-06 NOTE — ED Notes (Addendum)
Pt with right groin into right thigh pain, pt concerned about DVT.  Hx of same on the left thigh.  Hx of PE's as well. Pt has run out of Lovenox shot, ran out 5 days ago.  Pt requesting to speak with SW for medication assistance.

## 2021-03-06 NOTE — Care Management (Addendum)
Patient has Medicaid and is ineligible for MATCH medication assistance 1345 spoke to patient, his medicaid is onl;y family planning, they did not get it right. He will call DSS office Monday. Also encouraged hijm to [facility] to get established with PCP. MATCH for for medication assistance.

## 2021-03-06 NOTE — ED Triage Notes (Signed)
Patient concerned that he may have blood clot to right upper leg, groin area. States that he has had pain here for the past three days. States that he has had one on other side and is supposed to be on blood thinner but ran out of Lovenox injections.

## 2021-03-06 NOTE — ED Notes (Signed)
ED Provider at bedside. 

## 2021-03-10 NOTE — Progress Notes (Unsigned)
NO SHOW

## 2021-03-11 ENCOUNTER — Inpatient Hospital Stay (HOSPITAL_COMMUNITY): Payer: Medicaid Other | Admitting: Physician Assistant

## 2021-03-11 ENCOUNTER — Inpatient Hospital Stay (HOSPITAL_COMMUNITY): Payer: Medicaid Other

## 2021-03-16 ENCOUNTER — Telehealth (HOSPITAL_COMMUNITY): Payer: Self-pay | Admitting: *Deleted

## 2021-03-16 NOTE — Telephone Encounter (Signed)
Contacted patient regarding getting a repeat INR today, as the Coumadin Clinic is unable to take new patients that are not their patients for management and has not had one since 03/06/21.  Is unable to come until Friday's appointments due to caring for his children and working.  Keith Norman made aware.  We will check INR Friday prior to appointment. ?

## 2021-03-17 NOTE — Progress Notes (Signed)
[facility] [address] 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 Keith Norman follows at our clinic for recurrent unprovoked DVT and PE.  He had unprovoked PE with infarction in 2014 and was 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  [place] and was out of Xarelto for a few weeks, subsequently started on Eliquis, and diagnosed with extensive left lower extremity DVT on 07/15/2019, when he had been started on Eliquis 5 days prior (apparently he was taking 10 mg once daily).  He was started on Eliquis loading dose at that time, followed by 5 mg twice daily dosing.  He reports that he was taking Eliquis without fail, but he again presented to the hospital on 01/19/2021 with shortness of breath and was found to have a distal central left pulmonary embolus as well as 5 pulmonary lobe segmental and subsegmental PE; no associated right heart strain.  Ultrasound of lower extremities on 01/20/2021 showed extensive bilateral DVT involving the right and left femoral veins through the imaged right and left tibial veins.  Patient was deemed resistant to Eliquis and was started on Lovenox 120 mg every 12 hours.  Due to lack of insurance and high cost of Lovenox, patient was switched to Coumadin with Lovenox bridging.  [facility] showed the previous testing for prothrombin gene mutation, factor V Leiden, lupus anticoagulant, anticardiolipin antibodies, and antibeta-2 glycoprotein 1 antibodies were all negative.  INTERVAL HISTORY:  Mr. Keith Norman 41 y.o. male returns for routine follow-up of his recurrent unprovoked DVT and PE.   He was last seen by Dr. Ellin SabaKatragadda on 02/09/2021.  At  that time, he noted financial strain due to lack of insurance and high cost of Lovenox, therefore was switched to Coumadin with Lovenox bridging.  He presented to the ED on 03/06/2021 with increased discomfort in his right thigh.  He reported to the ED doctor that he was taking Coumadin 5 mg daily without any missed doses, but had run out of Lovenox for 5 days beforehand.  He denied any chest pain or shortness of breath.  His INR was found to be subtherapeutic at 1.2, and therefore he was discharged on increased dose of Coumadin 7.5 mg daily as well as additional Lovenox bridging x5 days.  Bilateral venous duplex remained positive for bilateral largely occlusive DVT with similar clot distribution to the study from 01/20/2021.  He was scheduled to follow-up with our office after ED visit, but unfortunately missed that appointment until he was able to return for today's office visit.  At today's visit, patient reports that he was not able to obtain Lovenox after his ED visit as this was cost prohibitive.  He has continued to take his Coumadin 7.5 mg daily without any missed doses.  He continues to have intermittent bilateral leg pain and reports dependent edema when he is on his legs for too long.  However, this is improved over the past several weeks.  He also reports that his breathing is better.  He denies any chest pain.  He continues to have a slight dry cough, which he reports is how he usually knows that he has a PE.  He denies any  abnormal bleeding while he is taking Coumadin, such as hematemesis, hematochezia, melena, or epistaxis.  He has 100% energy and 100% appetite. He endorses that he is maintaining a stable weight.   REVIEW OF SYSTEMS:  Review of Systems  Constitutional:  Negative for appetite change, chills, diaphoresis, fatigue, fever and unexpected weight change.  HENT:   Negative for lump/mass and nosebleeds.   Eyes:  Negative for eye problems.  Respiratory:  Positive for cough (Dry).  Negative for hemoptysis and shortness of breath.   Cardiovascular:  Positive for leg swelling (Dependent edema if standing for too long). Negative for chest pain and palpitations.  Gastrointestinal:  Negative for abdominal pain, blood in stool, constipation, diarrhea, nausea and vomiting.  Genitourinary:  Negative for hematuria.   Skin: Negative.   Neurological:  Negative for dizziness, headaches and light-headedness.  Hematological:  Does not bruise/bleed easily.     PAST MEDICAL/SURGICAL HISTORY:  Past Medical History:  Diagnosis Date   Hypertension    Pulmonary embolism (HCC)    No past surgical history on file.   SOCIAL HISTORY:  Social History   Socioeconomic History   Marital status: Single    Spouse name: Not on file   Number of children: Not on file   Years of education: Not on file   Highest education level: Not on file  Occupational History   Not on file  Tobacco Use   Smoking status: Never   Smokeless tobacco: Never  Substance and Sexual Activity   Alcohol use: Not Currently   Drug use: Never   Sexual activity: Not on file  Other Topics Concern   Not on file  Social History Narrative   Not on file   Social Determinants of Health   Financial Resource Strain: Not on file  Food Insecurity: Not on file  Transportation Needs: Not on file  Physical Activity: Not on file  Stress: Not on file  Social Connections: Not on file  Intimate Partner Violence: Not on file    FAMILY HISTORY:  Family History  Problem Relation Age of Onset   Healthy Mother    Healthy Father     CURRENT MEDICATIONS:  Outpatient Encounter Medications as of 03/18/2021  Medication Sig   acetaminophen (TYLENOL) 325 MG tablet Take 2 tablets (650 mg total) by mouth every 6 (six) hours as needed for mild pain (or Fever >/= 101).   amLODipine (NORVASC) 5 MG tablet Take 1 tablet (5 mg total) by mouth daily.   enoxaparin (LOVENOX) 120 MG/0.8ML injection Inject 1 syringe into the skin twice  a day   warfarin (COUMADIN) 5 MG tablet Take 1.5 tablets (7.5 mg total) by mouth every evening.   No facility-administered encounter medications on file as of 03/18/2021.    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 All nuts   Lac Bovis Diarrhea     PHYSICAL EXAM:  ECOG PERFORMANCE STATUS: 1 - Symptomatic but completely ambulatory  There were no vitals filed for this visit. There were no vitals filed for this visit. Physical Exam Constitutional:      Appearance: Normal appearance.  HENT:     Head: Normocephalic and atraumatic.     Mouth/Throat:     Mouth: Mucous membranes are moist.  Eyes:     Extraocular Movements: Extraocular movements intact.     Pupils: Pupils are equal, round, and reactive to light.  Cardiovascular:  Rate and Rhythm: Normal rate and regular rhythm.     Pulses: Normal pulses.     Heart sounds: Normal heart sounds.  Pulmonary:     Effort: Pulmonary effort is normal.     Breath sounds: Normal breath sounds.  Abdominal:     General: Bowel sounds are normal.     Palpations: Abdomen is soft.     Tenderness: There is no abdominal tenderness.  Musculoskeletal:        General: No swelling.     Right lower leg: No edema.     Left lower leg: Edema (Trace LLE edema with palpable bulge at left lateral ankle) present.  Lymphadenopathy:     Cervical: No cervical adenopathy.  Skin:    General: Skin is warm and dry.  Neurological:     General: No focal deficit present.     Mental Status: He is alert and oriented to person, place, and time.  Psychiatric:        Mood and Affect: Mood normal.        Behavior: Behavior normal.     LABORATORY DATA:  I have reviewed the labs as listed.  CBC    Component Value Date/Time   WBC 7.4 03/06/2021 1144   RBC 5.22 03/06/2021 1144   HGB 14.5 03/06/2021 1144   HCT 45.0 03/06/2021 1144   PLT 247 03/06/2021 1144    MCV 86.2 03/06/2021 1144   MCH 27.8 03/06/2021 1144   MCHC 32.2 03/06/2021 1144   RDW 14.1 03/06/2021 1144   LYMPHSABS 2.0 03/06/2021 1144   MONOABS 0.7 03/06/2021 1144   EOSABS 0.1 03/06/2021 1144   BASOSABS 0.0 03/06/2021 1144   CMP Latest Ref Rng & Units 03/06/2021 01/21/2021 01/20/2021  Glucose 70 - 99 mg/dL 300(T) 96 88  BUN 6 - 20 mg/dL 19 9 13   Creatinine 0.61 - 1.24 mg/dL 6.22 6.33(H) 5.45(G)  Sodium 135 - 145 mmol/L 135 135 133(L)  Potassium 3.5 - 5.1 mmol/L 4.5 3.9 4.0  Chloride 98 - 111 mmol/L 103 101 100  CO2 22 - 32 mmol/L 24 25 22   Calcium 8.9 - 10.3 mg/dL 9.2 2.5(W) 3.8(L)  Total Protein 6.5 - 8.1 g/dL - - 7.9  Total Bilirubin 0.3 - 1.2 mg/dL - - 0.8  Alkaline Phos 38 - 126 U/L - - 74  AST 15 - 41 U/L - - 23  ALT 0 - 44 U/L - - 30    DIAGNOSTIC IMAGING:  I have independently reviewed the relevant imaging and discussed with the patient.  ASSESSMENT & PLAN: 1.  Recurrent unprovoked DVT and pulmonary embolism: - Unprovoked PE with infarction in 2014, 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 - He moved to [place] and was out of Xarelto for few weeks, subsequently started on Eliquis, diagnosed with extensive left lower extremity DVT on 07/15/2019 when he was started on Eliquis for 5 days prior (apparently he was taking 10 mg once daily) - He was started on Eliquis loading dose followed by 5 mg twice daily dosing. - He reports he was taking Eliquis without fail.  He presented to the hospital on 01/19/2021 with shortness of breath and was found to have distal central left pulmonary embolus as well as 5 pulmonary lobes segmental and subsegmental PE.  No associated right heart strain. - Ultrasound of the lower extremities on 01/20/2021 showed extensive DVT involving the right and left femoral veins through the imaged right and left tibial veins. - Patient was deemed a resistant to Eliquis, and was started  on Lovenox 120 mg every 12 hours. - All of these episodes are unprovoked.  I have reviewed  records from [facility] which showed previous testing for prothrombin gene mutation, factor V Leiden, lupus anticoagulant, anticardiolipin antibodies, antibeta-2 glycoprotein 1 antibodies were negative. - Due to personal history of multiple episodes of unprovoked thromboembolism, patient will need indefinite anticoagulation. - At last visit with Dr. Ellin Saba (02/09/2021) patient reported financial strain due to lack of insurance and high cost of Lovenox, therefore was switched to Coumadin with Lovenox bridging. - He presented to the ED on 03/06/2021 with increased discomfort in his right thigh.  He reported to the ED doctor that he was taking Coumadin 5 mg daily without any missed doses, but had run out of Lovenox for 5 days beforehand.  He denied any chest pain or shortness of breath.  His INR was found to be subtherapeutic at 1.2, and therefore he was discharged on increased dose of Coumadin 7.5 mg daily as well as additional Lovenox bridging x5 days.  Bilateral venous duplex remained positive for bilateral largely occlusive DVT with similar clot distribution to the study from 01/20/2021.  He was scheduled to follow-up with our office after ED visit, but unfortunately missed that appointment until he was able to return for today's office visit. - Since ED visit on 03/06/2021, he has been taking Coumadin 7.5 mg nightly without fail.  However, he was unable to obtain Lovenox from pharmacy due to cost. - INR today is slightly closer to target range at 1.4 (goal 2.0-3.0) - He notes some improvement in his breathing and bilateral leg swelling - PLAN: We will increase warfarin to 10 mg nightly. - Repeat INR in 4 days (Tuesday, 03/22/2021).  We will call with results and further instructions. - Lovenox injection (1.5 mg/kilogram) given in clinic today.  Application sent to obtain financial assistance with Lovenox bridging for patient. - He is already scheduled for repeat D-dimer in 4 months.  If D-dimer is normal at that  time, we will consider switching him to either once a day Lovenox (1.5 mg/kilogram) or consider restarting him on Xarelto 20 mg QHS, since he did not have any clots for 6 years while he was on this dose and only stopped taking it due to insurance issues.  Application sent to obtain financial assistance for Xarelto.  If approved, we would switch patient to Xarelto rather than Coumadin.  2.  Social/family history: - He lives at home with his wife.  He works as a [employer] manager. - He is non-smoker and nonalcoholic. - Maternal grandmother had DVT after CABG at age 31.  No family history of lupus anticoagulant.  No family history of malignancies.   PLAN SUMMARY & DISPOSITION: Repeat INR on Tuesday, 03/22/2021 - will call with results Follow-up as scheduled in 4 months  All questions were answered. The patient knows to call the clinic with any problems, questions or concerns.  Medical decision making: Moderate  Time spent on visit: I spent 25 minutes counseling the patient face to face. The total time spent in the appointment was 40 minutes and more than 50% was on counseling.   Carnella Guadalajara, PA-C  03/18/2021 4:55 PM

## 2021-03-18 ENCOUNTER — Inpatient Hospital Stay (HOSPITAL_COMMUNITY): Payer: Medicaid Other

## 2021-03-18 ENCOUNTER — Inpatient Hospital Stay (HOSPITAL_COMMUNITY): Payer: Self-pay

## 2021-03-18 ENCOUNTER — Inpatient Hospital Stay (HOSPITAL_BASED_OUTPATIENT_CLINIC_OR_DEPARTMENT_OTHER): Payer: Medicaid Other | Admitting: Physician Assistant

## 2021-03-18 ENCOUNTER — Ambulatory Visit (HOSPITAL_COMMUNITY): Payer: Self-pay | Admitting: Physician Assistant

## 2021-03-18 ENCOUNTER — Other Ambulatory Visit: Payer: Self-pay

## 2021-03-18 ENCOUNTER — Inpatient Hospital Stay (HOSPITAL_COMMUNITY): Payer: Self-pay | Attending: Hematology

## 2021-03-18 VITALS — BP 135/89 | HR 76 | Temp 97.0°F | Resp 18

## 2021-03-18 DIAGNOSIS — I2699 Other pulmonary embolism without acute cor pulmonale: Secondary | ICD-10-CM

## 2021-03-18 DIAGNOSIS — Z86711 Personal history of pulmonary embolism: Secondary | ICD-10-CM | POA: Insufficient documentation

## 2021-03-18 DIAGNOSIS — Z86718 Personal history of other venous thrombosis and embolism: Secondary | ICD-10-CM | POA: Insufficient documentation

## 2021-03-18 DIAGNOSIS — I824Y3 Acute embolism and thrombosis of unspecified deep veins of proximal lower extremity, bilateral: Secondary | ICD-10-CM

## 2021-03-18 DIAGNOSIS — Z7901 Long term (current) use of anticoagulants: Secondary | ICD-10-CM | POA: Insufficient documentation

## 2021-03-18 LAB — PROTIME-INR
INR: 1.4 — ABNORMAL HIGH (ref 0.8–1.2)
Prothrombin Time: 17.3 seconds — ABNORMAL HIGH (ref 11.4–15.2)

## 2021-03-18 MED ORDER — WARFARIN SODIUM 5 MG PO TABS: 10.0000 mg | ORAL_TABLET | Freq: Every day | ORAL | 0 refills | Status: DC

## 2021-03-18 MED ORDER — ENOXAPARIN SODIUM 150 MG/ML IJ SOSY
150.0000 mg | PREFILLED_SYRINGE | Freq: Once | INTRAMUSCULAR | Status: AC
  Administered 2021-03-18: 150 mg via SUBCUTANEOUS
  Filled 2021-03-18: qty 1

## 2021-03-18 MED ORDER — ENOXAPARIN SODIUM 150 MG/ML IJ SOSY
150.0000 mg | PREFILLED_SYRINGE | Freq: Once | INTRAMUSCULAR | Status: DC
  Filled 2021-03-18: qty 1

## 2021-03-18 NOTE — Patient Instructions (Addendum)
[facility] ?Discharge Instructions ? ?You were seen today by Rojelio Brenner PA-C for your history of blood clots and for management of your blood thinner. ? ?Due to your history of multiple unprovoked blood clots, it is EXTREMELY IMPORTANT that you are on adequate dosages of blood thinners at all times. ? ?For now, we will increase your dose of warfarin (Coumadin) to 10 mg each night.  You can take your first increased dose today.  A refill prescription has been sent to your pharmacy. ?**Please read carefully through the attached handouts for IMPORTANT INFORMATION on warfarin and its association with vitamin K as well as other potential risks and side effects. ? ?Since your warfarin is not yet making your blood thin enough (i.e. you are not at your target INR), you need to continue Lovenox injections as prescribed until your INR is between 2.0-3.0. ?- We will give you a Lovenox injection today during your office visit. ?- We will check your INR again on Tuesday, 03/22/2021, and we will call you with the results and further recommendations. ? ?We understand that financial strain affects your ability to obtain needed medications. ?We will submit applications to assist with the cost of Lovenox. ?We will submit applications to see if you qualify for assistance with Xarelto.  If we are able to find affordable Xarelto for you, we would be able to use this instead of your Coumadin & Lovenox combination. ? ?Read the attached handouts regarding symptoms of DVT and PE, and seek IMMEDIATE medical attention if you notice any symptoms of blood clots or any signs of significant bleeding while you are on blood thinners. ? ?LABS: Return next week as scheduled to repeat INR. ? ?FOLLOW-UP APPOINTMENT: Your next follow-up appointment is scheduled in 4 months.  However, if you have any further issues before that time, do not hesitate to call our office for another appointment. ? ? ?Thank you for  choosing [facility] at [facility] to provide your oncology and hematology care.  To afford each patient quality time with our provider, please arrive at least 15 minutes before your scheduled appointment time.  ? ?If you have a lab appointment with the Cancer Center please come in thru the Main Entrance and check in at the main information desk. ? ?You need to re-schedule your appointment should you arrive 10 or more minutes late.  We strive to give you quality time with our providers, and arriving late affects you and other patients whose appointments are after yours.  Also, if you no show three or more times for appointments you may be dismissed from the clinic at the providers discretion.     ?Again, thank you for choosing [facility]  Our hope is that these requests will decrease the amount of time that you wait before being seen by our physicians.       ?_____________________________________________________________ ? ?Should you have questions after your visit to [facility], please contact our office at [phone] and follow the prompts.  Our office hours are 8:00 a.m. and 4:30 p.m. Monday - Friday.  Please note that voicemails left after 4:00 p.m. may not be returned until the following business day.  We are closed weekends and major holidays.  You do have access to a nurse 24-7, just call the main number to the clinic [phone] and do not press any options, hold on the line and a nurse will answer the  phone.   ? ?For prescription refill requests, have your pharmacy contact our office and allow 72 hours.   ? ?Due to Covid, you will need to wear a mask upon entering the hospital. If you do not have a mask, a mask will be given to you at the Main Entrance upon arrival. For doctor visits, patients may have 1 support person age 57 or older with them. For treatment visits, patients can not have anyone with them due to social distancing guidelines and our  immunocompromised population.  ? ? ? ?

## 2021-03-18 NOTE — Progress Notes (Signed)
Keith Norman presents today for injection per the provider's orders.  Lovenox administration without incident; injection site WNL; see MAR for injection details.  Patient tolerated procedure well and without incident.  No questions or complaints noted at this time. Patient remained stable for entire visit. Patient discharged ambulatory and in stable condition.  ?

## 2021-03-22 ENCOUNTER — Telehealth (HOSPITAL_COMMUNITY): Payer: Self-pay | Admitting: Physician Assistant

## 2021-03-22 ENCOUNTER — Inpatient Hospital Stay (HOSPITAL_COMMUNITY): Payer: Self-pay

## 2021-03-22 ENCOUNTER — Other Ambulatory Visit: Payer: Self-pay

## 2021-03-22 DIAGNOSIS — I824Y3 Acute embolism and thrombosis of unspecified deep veins of proximal lower extremity, bilateral: Secondary | ICD-10-CM

## 2021-03-22 DIAGNOSIS — Z7901 Long term (current) use of anticoagulants: Secondary | ICD-10-CM

## 2021-03-22 DIAGNOSIS — I2699 Other pulmonary embolism without acute cor pulmonale: Secondary | ICD-10-CM

## 2021-03-22 LAB — PROTIME-INR
INR: 1.5 — ABNORMAL HIGH (ref 0.8–1.2)
Prothrombin Time: 17.8 seconds — ABNORMAL HIGH (ref 11.4–15.2)

## 2021-03-22 NOTE — Telephone Encounter (Signed)
INR today remains subtherapeutic at 1.5. ?Patient has been taking warfarin 10 mg nightly since Friday through 03/18/2021.  He has not missed any doses.  He has not noticed any adverse bleeding events. ? ?We will increase warfarin to 12.5 mg nightly (two-and-a-half 5 mg tablets nightly). ?We will recheck INR and further adjust warfarin dose as necessary next Monday, 03/28/2021. ? ?Patient verbalizes understanding and acceptance of the above. ? ?Rojelio Brenner PA-C ?03/22/2021 2:36 PM ? ?

## 2021-03-28 ENCOUNTER — Inpatient Hospital Stay (HOSPITAL_COMMUNITY): Payer: Self-pay

## 2021-03-31 ENCOUNTER — Inpatient Hospital Stay (HOSPITAL_COMMUNITY): Payer: Self-pay

## 2021-03-31 ENCOUNTER — Telehealth (HOSPITAL_COMMUNITY): Payer: Self-pay | Admitting: *Deleted

## 2021-03-31 LAB — PROTIME-INR
INR: 1.8 — ABNORMAL HIGH (ref 0.8–1.2)
Prothrombin Time: 20.9 seconds — ABNORMAL HIGH (ref 11.4–15.2)

## 2021-03-31 NOTE — Telephone Encounter (Signed)
Message received from R.Pennington-PA to call pt and let him know to increase Warfarin 3 tablets of his 5 mg tablets (15 mg) each night due INR increase. Provider also wanted pt to  repeat INR labs next week. Scheduler made aware and pt was called with lab appointment for next week. ?

## 2021-03-31 NOTE — Progress Notes (Signed)
RN POOL:  Please contact patient and instruct him to increase warfarin to 15 mg each night (3 of his 5 mg tabs).  Please schedule him for repeat INR next week and inform him of the date/time.

## 2021-04-05 ENCOUNTER — Other Ambulatory Visit (HOSPITAL_COMMUNITY): Payer: Medicaid Other

## 2021-04-06 ENCOUNTER — Telehealth (HOSPITAL_COMMUNITY): Payer: Self-pay | Admitting: *Deleted

## 2021-04-06 NOTE — Telephone Encounter (Signed)
Patient has missed several appointments for INR checks for coumadin management.  Most recently, did not have 04/05/21 INR done.  I have attempted to contact with no success.  Messages have been left for patient to reschedule visit.  ?

## 2021-04-11 ENCOUNTER — Telehealth (HOSPITAL_COMMUNITY): Payer: Self-pay | Admitting: *Deleted

## 2021-04-11 ENCOUNTER — Other Ambulatory Visit (HOSPITAL_COMMUNITY): Payer: Self-pay | Admitting: *Deleted

## 2021-04-11 DIAGNOSIS — I824Y3 Acute embolism and thrombosis of unspecified deep veins of proximal lower extremity, bilateral: Secondary | ICD-10-CM

## 2021-04-11 DIAGNOSIS — I2699 Other pulmonary embolism without acute cor pulmonale: Secondary | ICD-10-CM

## 2021-04-11 MED ORDER — WARFARIN SODIUM 5 MG PO TABS: 10.0000 mg | ORAL_TABLET | Freq: Every day | ORAL | 0 refills | Status: DC

## 2021-04-11 NOTE — Telephone Encounter (Signed)
Was able to contact patient regarding missed INR appointments.  Stated that he lost his coumadin and has not had for about a week.  New script sent with explanation for early refill.  Appointment made for Friday for INR.  Rojelio Brenner PAC made aware. ?

## 2021-04-12 ENCOUNTER — Other Ambulatory Visit (HOSPITAL_COMMUNITY): Payer: Self-pay | Admitting: *Deleted

## 2021-04-12 DIAGNOSIS — I2699 Other pulmonary embolism without acute cor pulmonale: Secondary | ICD-10-CM

## 2021-04-12 DIAGNOSIS — I824Y3 Acute embolism and thrombosis of unspecified deep veins of proximal lower extremity, bilateral: Secondary | ICD-10-CM

## 2021-04-12 MED ORDER — RIVAROXABAN 20 MG PO TABS: 20.0000 mg | ORAL_TABLET | Freq: Every day | ORAL | 11 refills | Status: AC

## 2021-04-12 MED ORDER — WARFARIN SODIUM 5 MG PO TABS: 15.0000 mg | ORAL_TABLET | Freq: Every day | ORAL | 0 refills | Status: DC

## 2021-04-12 NOTE — Progress Notes (Signed)
Patient has been approved for free Xarelto for 12 months.  He is aware to stop coumadin and begin taking 20 mg Xarelto QHS. ?

## 2021-04-13 ENCOUNTER — Telehealth (HOSPITAL_COMMUNITY): Payer: Self-pay | Admitting: *Deleted

## 2021-04-13 NOTE — Telephone Encounter (Signed)
Opened in error

## 2021-04-18 ENCOUNTER — Inpatient Hospital Stay (HOSPITAL_COMMUNITY): Payer: Self-pay

## 2021-06-02 ENCOUNTER — Other Ambulatory Visit (HOSPITAL_COMMUNITY): Payer: Self-pay

## 2021-06-09 ENCOUNTER — Ambulatory Visit (HOSPITAL_COMMUNITY): Payer: Self-pay | Admitting: Hematology

## 2021-06-09 ENCOUNTER — Ambulatory Visit (HOSPITAL_COMMUNITY): Payer: Self-pay | Admitting: Physician Assistant

## 2021-06-24 ENCOUNTER — Other Ambulatory Visit (HOSPITAL_COMMUNITY): Payer: Self-pay

## 2021-06-30 NOTE — Progress Notes (Deleted)
NO SHOW

## 2021-07-01 ENCOUNTER — Inpatient Hospital Stay (HOSPITAL_COMMUNITY): Payer: Commercial Managed Care - HMO | Attending: Hematology

## 2021-07-01 ENCOUNTER — Ambulatory Visit (HOSPITAL_COMMUNITY): Payer: Self-pay | Admitting: Physician Assistant

## 2023-03-13 IMAGING — CT CT ANGIO CHEST
2 of 6 series · 18 of 36 positions shown · IV contrast (omnipaque)
Comparison: CT angiography chest 03/16/2018

CLINICAL DATA: Pulmonary embolism (PE) suspected, high prob. Pt
arrives with c/o exertional SOB that started about a month ago. Pt
denies fevers. Pt endorses generalized body aches and cough. history
of PE. H768L-12 infection positive.

EXAM:
CT ANGIOGRAPHY CHEST WITH CONTRAST
TECHNIQUE: Multidetector CT imaging of the chest was performed using the
standard protocol during bolus administration of intravenous
contrast. Multiplanar CT image reconstructions and MIPs were
obtained to evaluate the vascular anatomy.
CONTRAST:  100mL OMNIPAQUE IOHEXOL 350 MG/ML SOLN

[Series 5: pe axial thins · axial · 0.64mm/px · z∈[+1346,+1594]mm · 17 of 342 slices shown]
[im 17/342  lung]
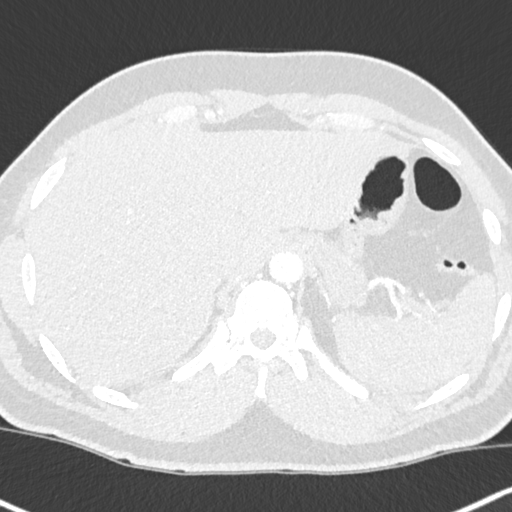
[im 33/342  mediastinal]
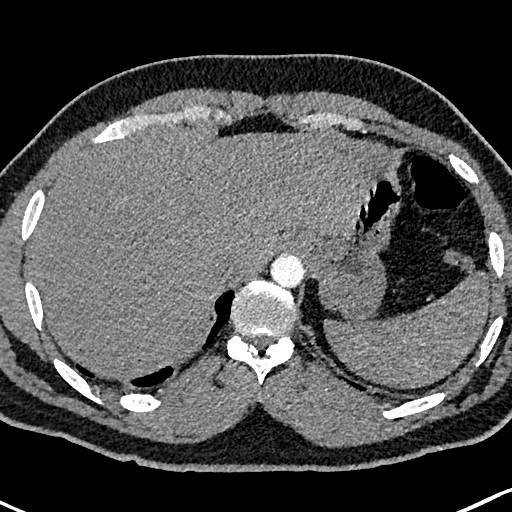
[im 49/342  lung]
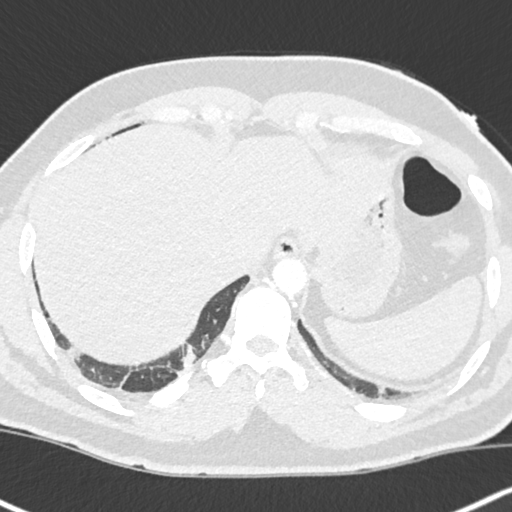
[im 82/342  mediastinal]
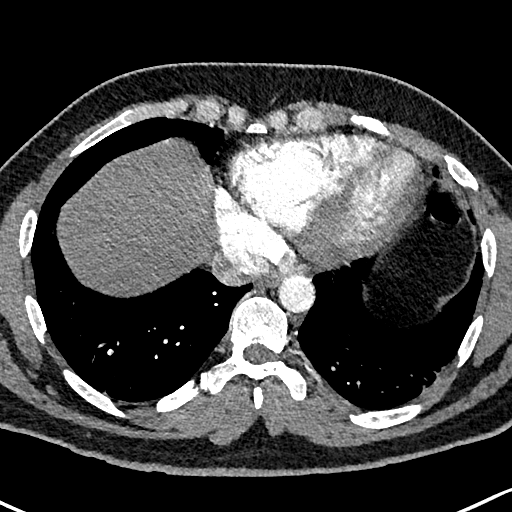
[im 98/342  lung]
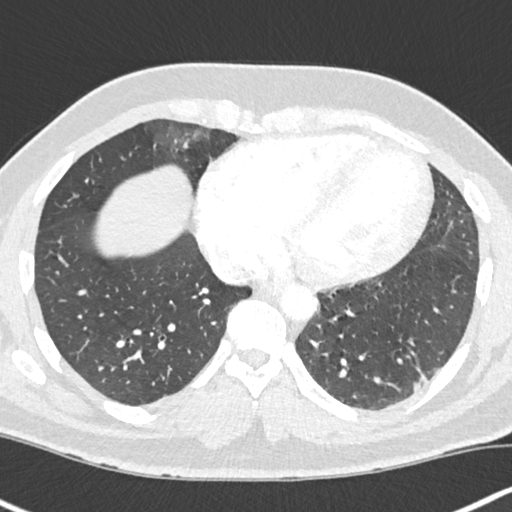
[im 114/342  mediastinal]
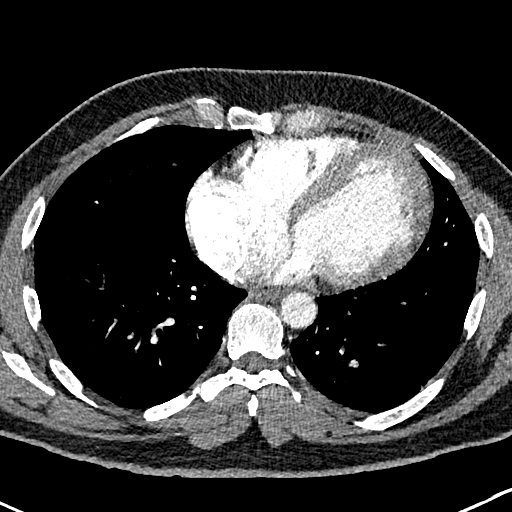
[im 130/342  lung]
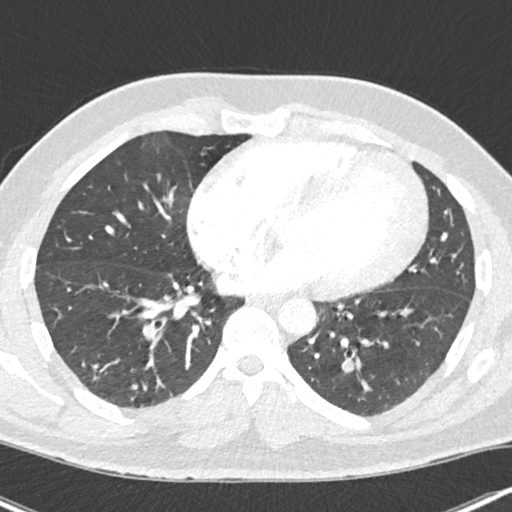
[im 147/342  mediastinal]
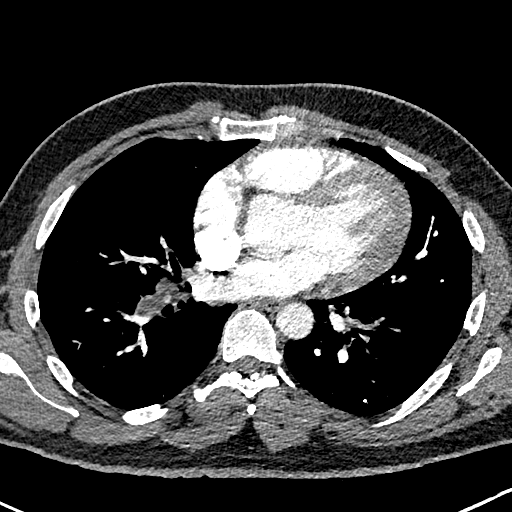
[im 179/342  lung]
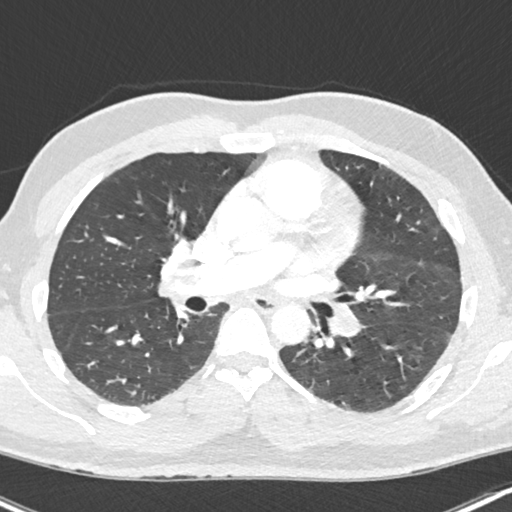
[im 195/342  mediastinal]
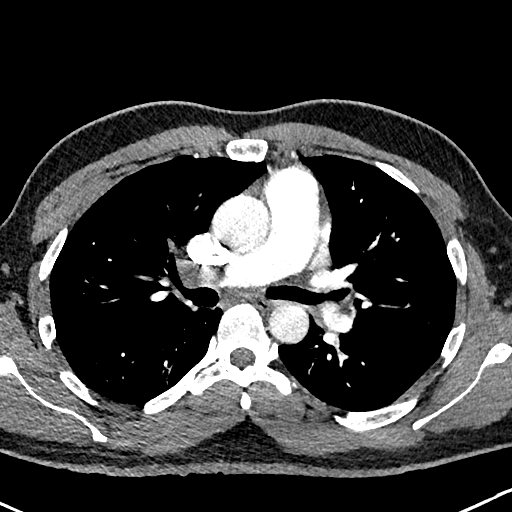
[im 212/342  lung]
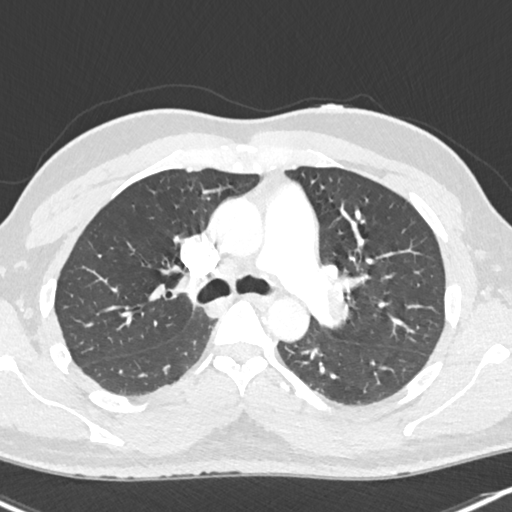
[im 228/342  mediastinal]
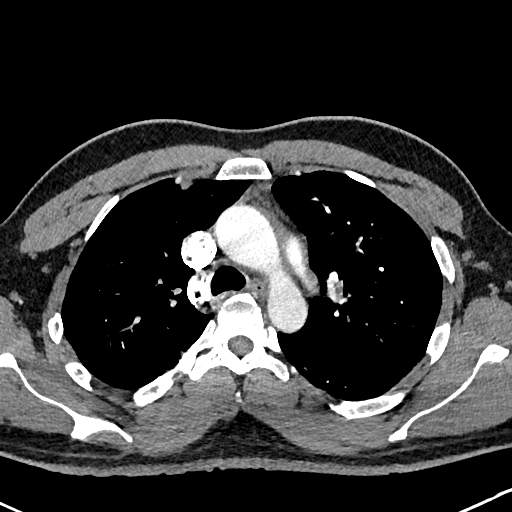
[im 244/342  lung]
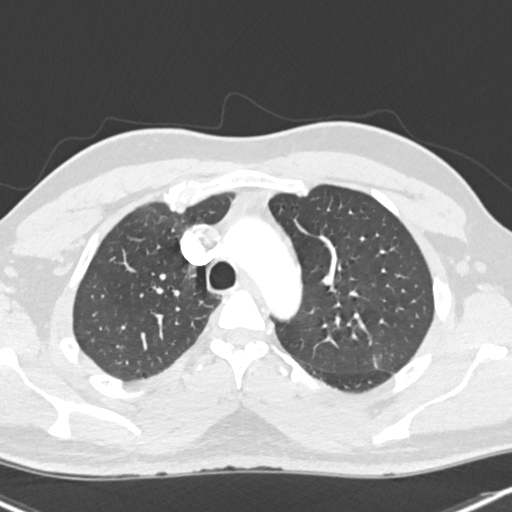
[im 260/342  mediastinal]
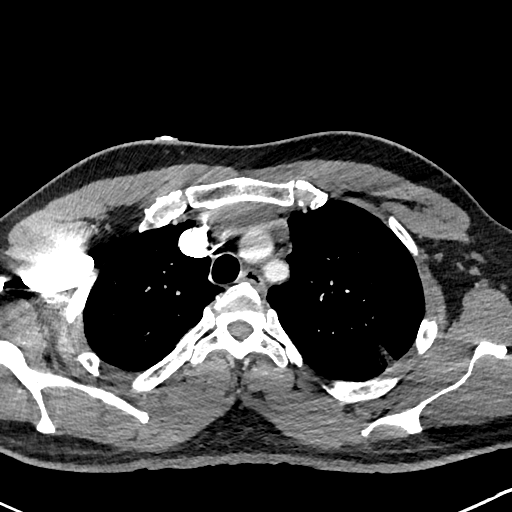
[im 293/342  lung]
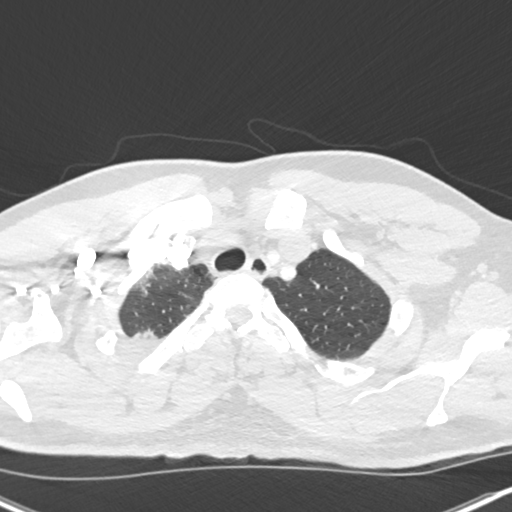
[im 309/342  mediastinal]
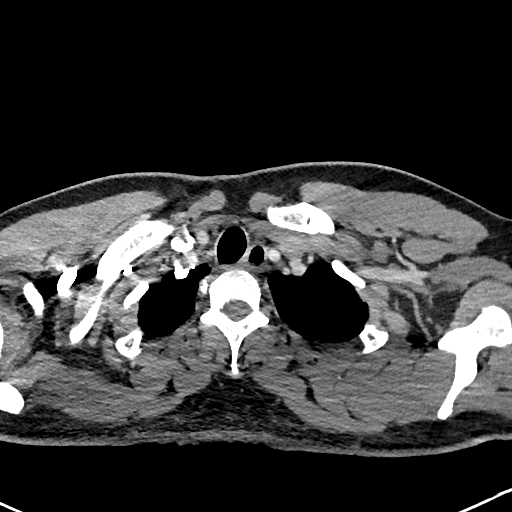
[im 325/342  lung]
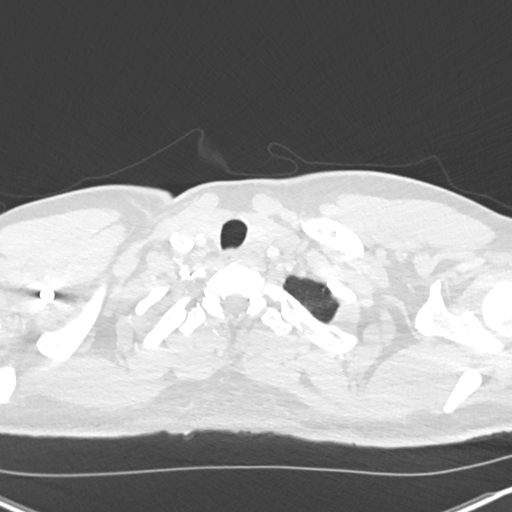

[Series 8: cor soft · coronal · 0.58mm/px · 1 of 116 slices shown]
[im 58/116  mediastinal]
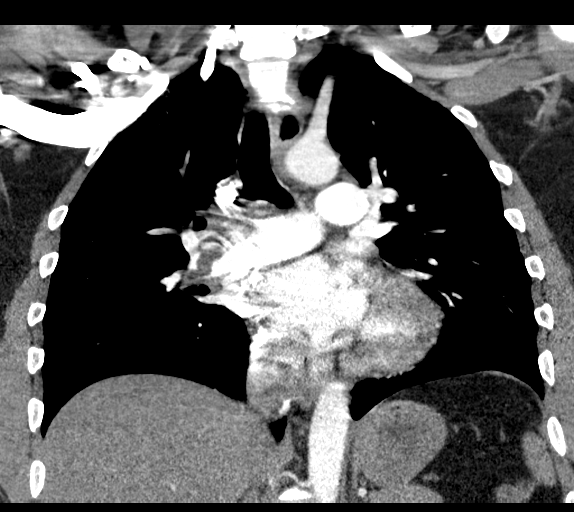

[18 of 36 positions shown; findings below may reference images not displayed]

FINDINGS: Cardiovascular: Satisfactory opacification of the pulmonary arteries
to the segmental level. No evidence of pulmonary embolism. Normal
heart size. No significant pericardial effusion. The thoracic aorta
is normal in caliber. No atherosclerotic plaque of the thoracic
aorta. No definite coronary artery calcifications.

Mediastinum/Nodes: No enlarged mediastinal, hilar, or axillary lymph
nodes. Thyroid gland, trachea, and esophagus demonstrate no
significant findings.

Lungs/Pleura: Right apical paraseptal emphysematous changes. Right
apical pleural/pulmonary scarring. Cannot excluded developing
pulmonary infarction within the lingula ([DATE]). There is a 2.2 x
cm ground-glass peripheral left upper lobe airspace opacity. No
pulmonary mass. No pleural effusion. No pneumothorax.

Upper Abdomen: No acute abnormality.

Musculoskeletal:

No chest wall abnormality.

No suspicious lytic or blastic osseous lesions. No acute displaced
fracture. Multilevel degenerative changes of the spine.

Review of the MIP images confirms the above findings.
IMPRESSION: 1. Distal central left pulmonary embolus as well as all five
pulmonary lobes segmental and subsegmental pulmonary emboli. No
associated right heart strain. Cannot excluded developing pulmonary
infarction within the lingula.
2. Indeterminate 2.2 x 1.4 cm ground-glass peripheral left upper
lobe airspace opacity. Recommend short-term repeat CT in 3 months
once H768L-12 infection is treated to evaluate for stability.
Non-contrast chest CT at 3-6 months is recommended. If nodules
persist, subsequent management will be based upon the most
suspicious nodule(s). This recommendation follows the consensus
statement: Guidelines for Management of Incidental Pulmonary Nodules
Detected on CT Images: From the [HOSPITAL] 7276; Radiology
7276; [DATE].
3.  Emphysema ([id number]).

These results were called by telephone at the time of interpretation
on 01/19/2021 at [DATE] to provider NISHANTH CHATMON , who verbally
acknowledged these results.

## 2023-03-13 IMAGING — DX DG CHEST 2V
2 series · 2 of 2 positions shown · non-contrast
Comparison: March 16, 2018.

CLINICAL DATA: Shortness of breath, cough.

EXAM:
CHEST - 2 VIEW

[chest pa]
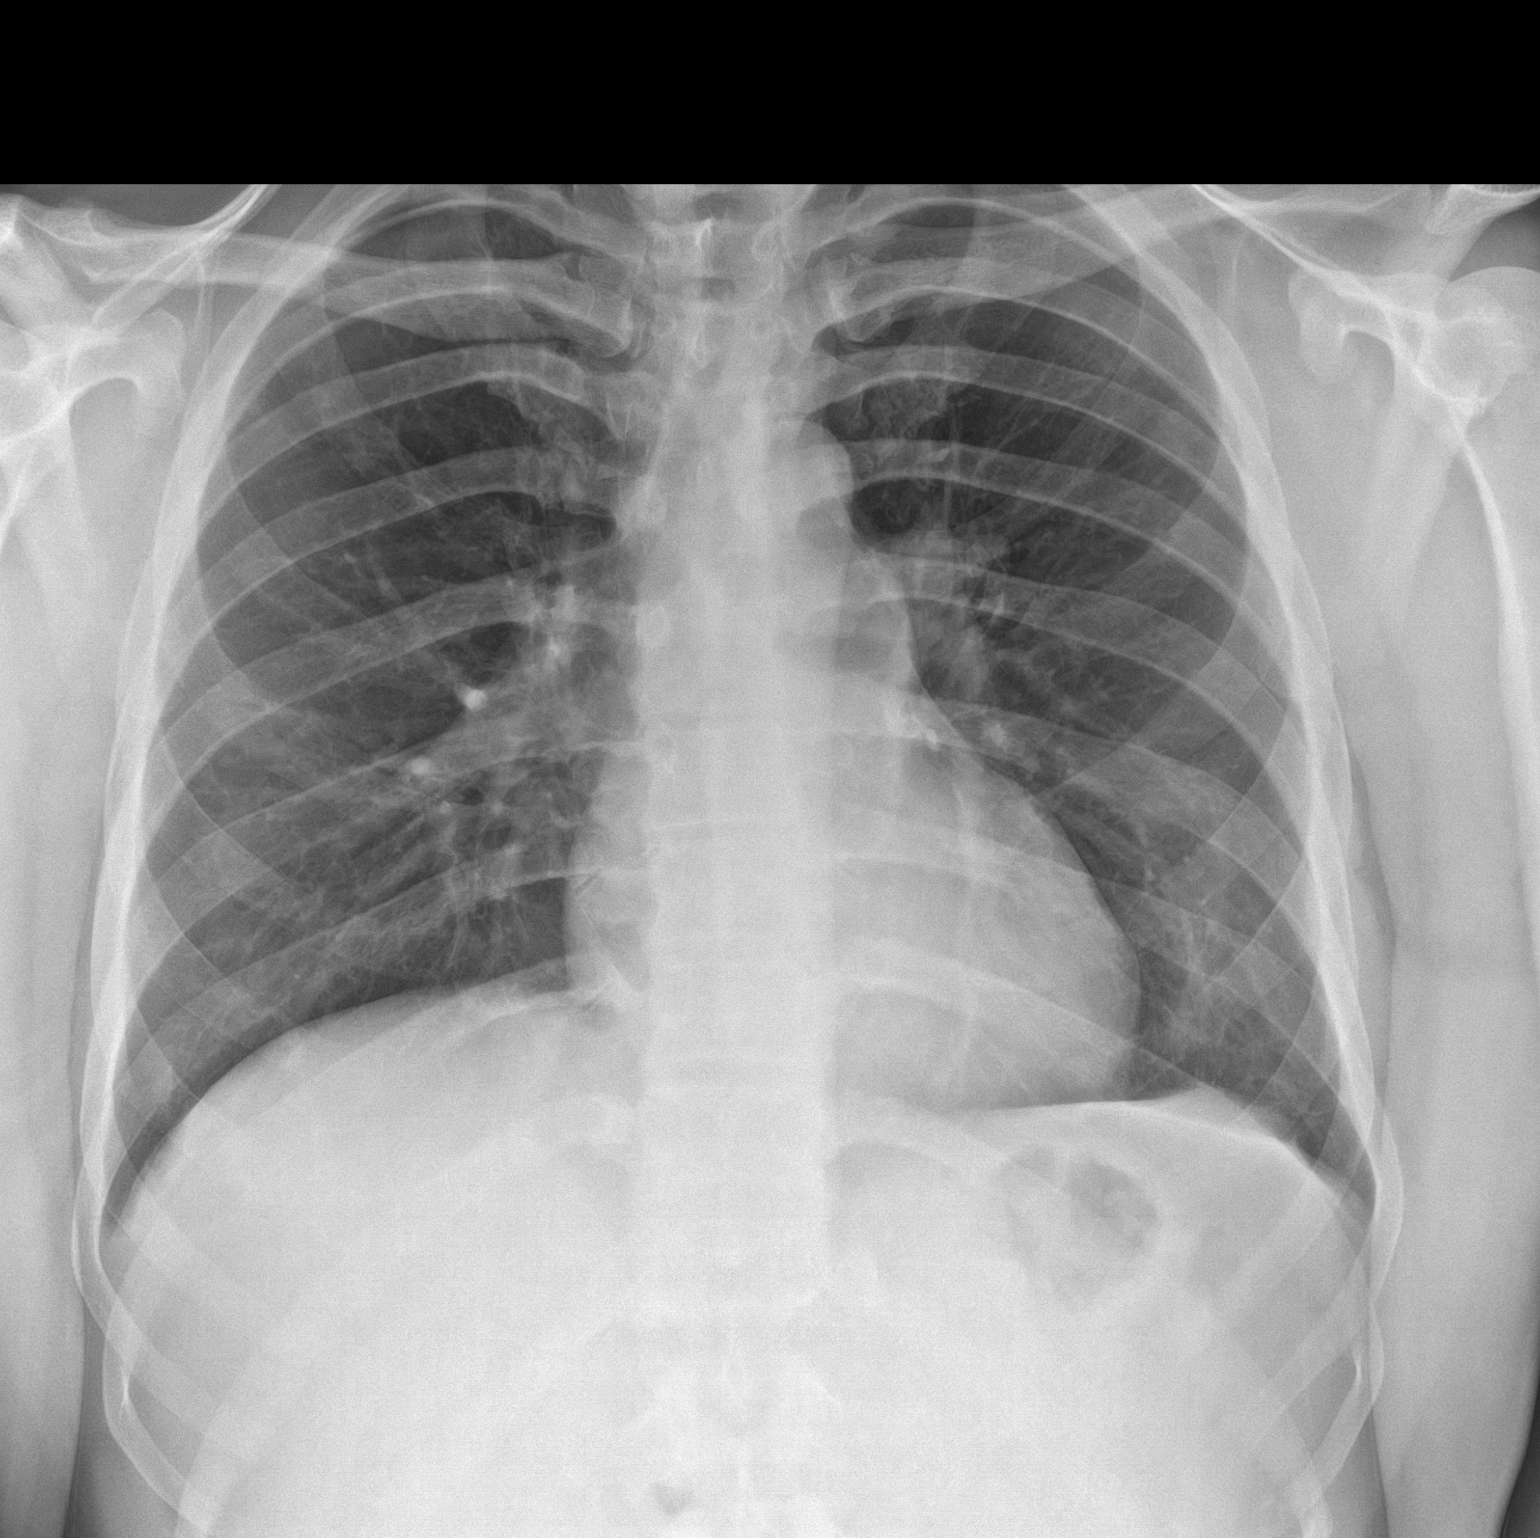

[chest lat]
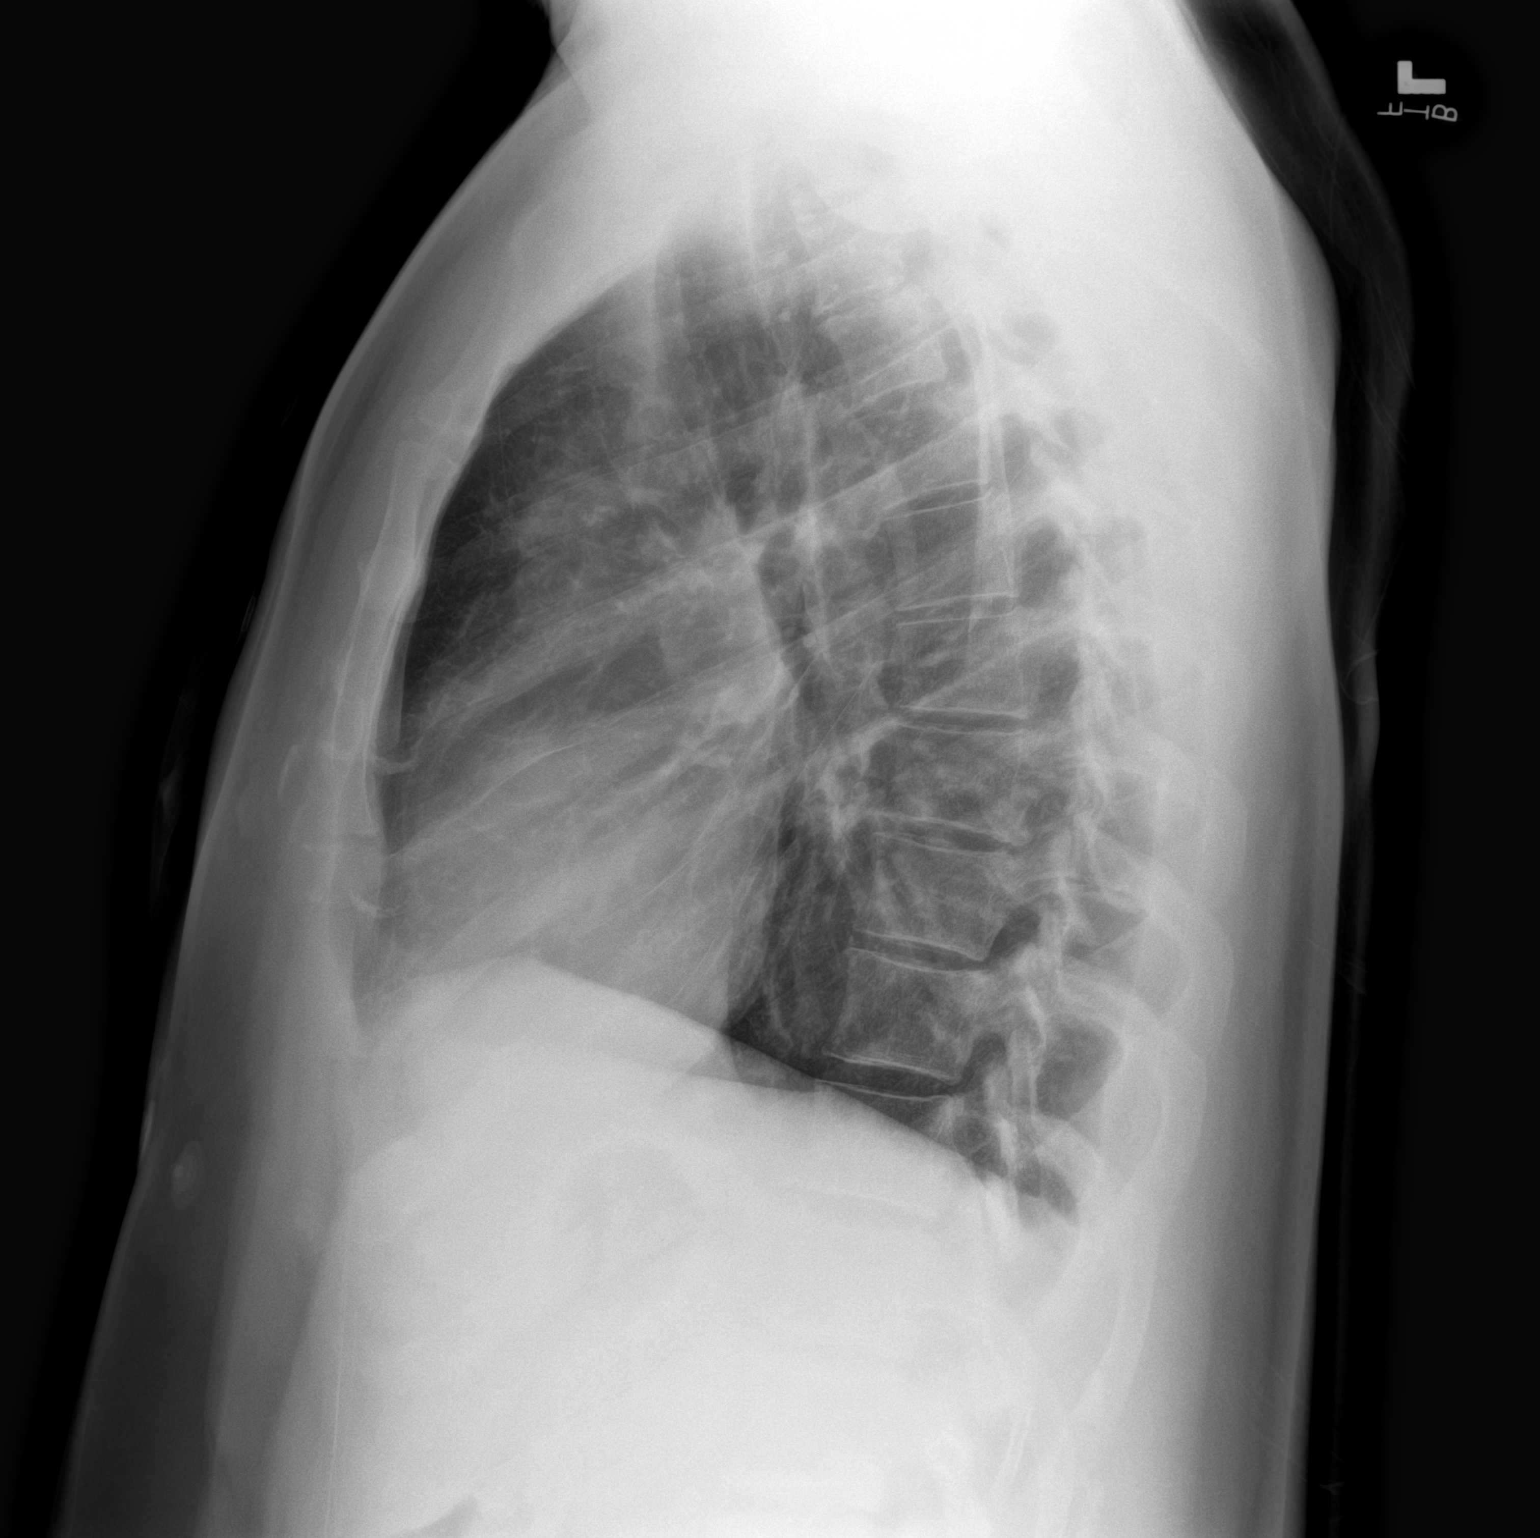

[2 of 2 positions shown; findings below may reference images not displayed]

FINDINGS: The heart size and mediastinal contours are within normal limits.
Both lungs are clear. The visualized skeletal structures are
unremarkable.
IMPRESSION: No active cardiopulmonary disease.

## 2023-03-14 IMAGING — US US EXTREM LOW VENOUS
1 series · 13 of 24 positions shown · non-contrast
Comparison: None.

CLINICAL DATA: Recent diagnosis of pulmonary embolism. Evaluate for
DVT.



[Series 1: us venous img lower bilat (dvt) · portal-venous · 13 of 89 slices shown]
[im 1/89]
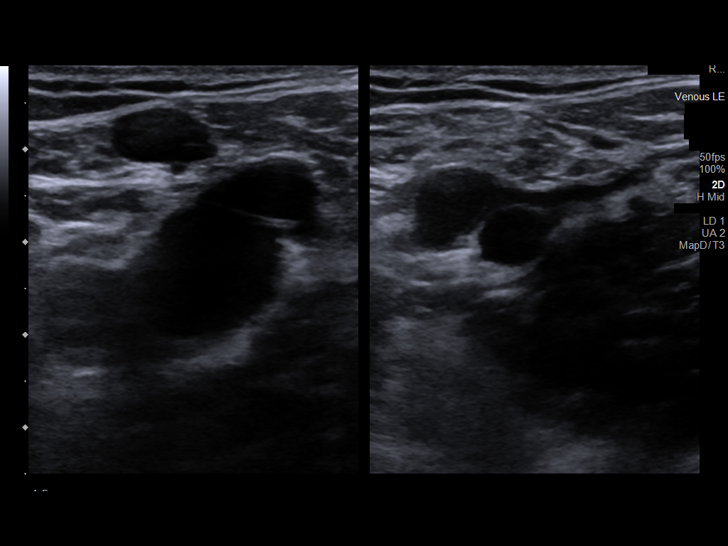
[im 8/89]
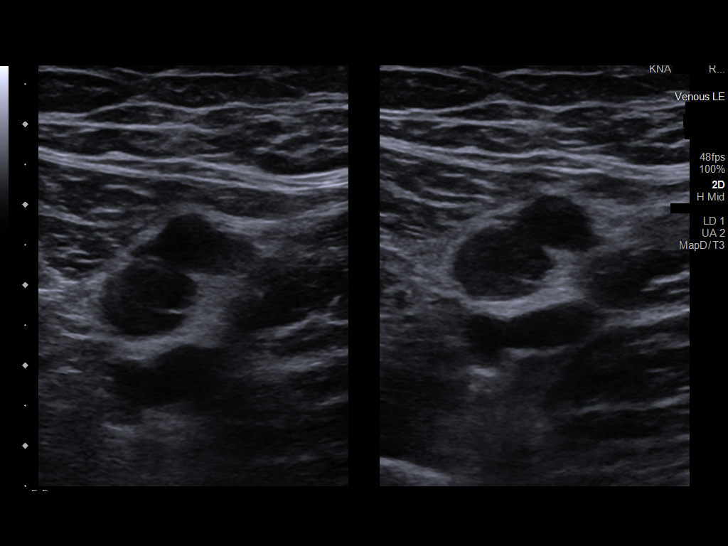
[im 16/89]
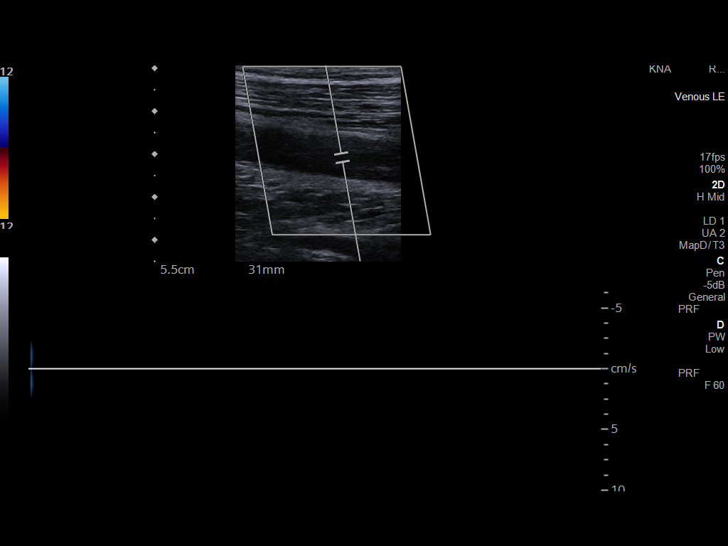
[im 23/89]
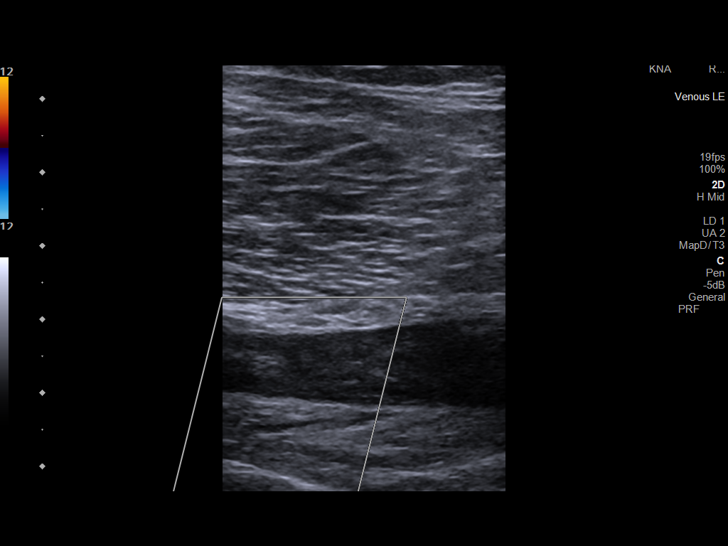
[im 31/89]
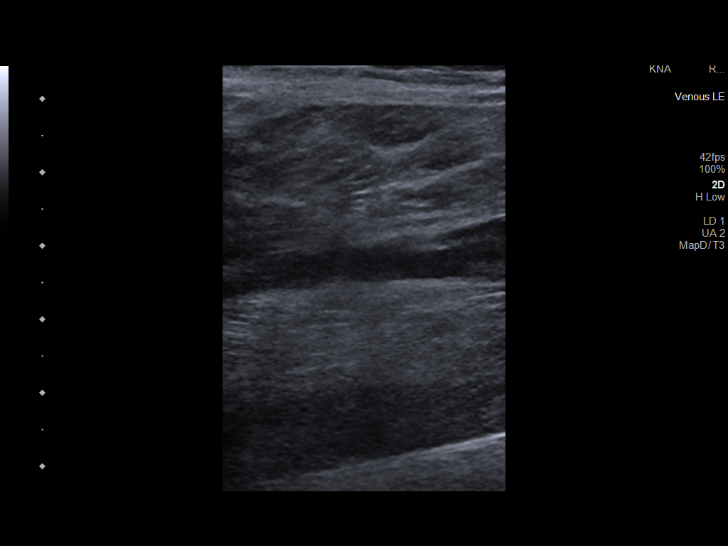
[im 39/89]
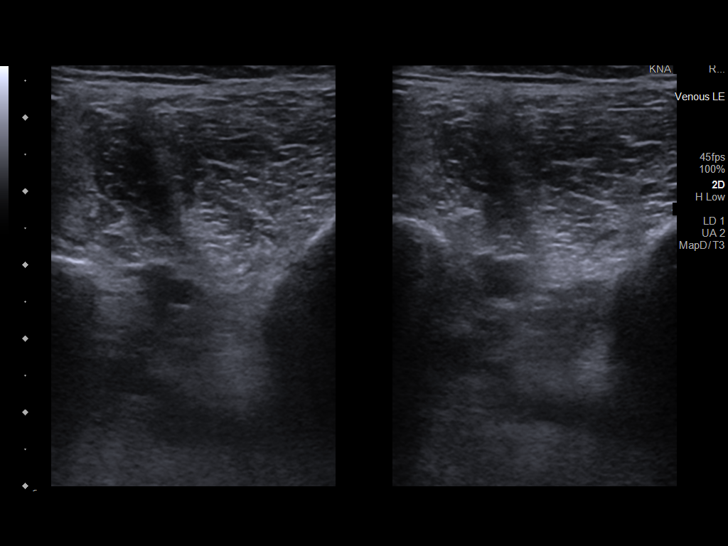
[im 46/89]
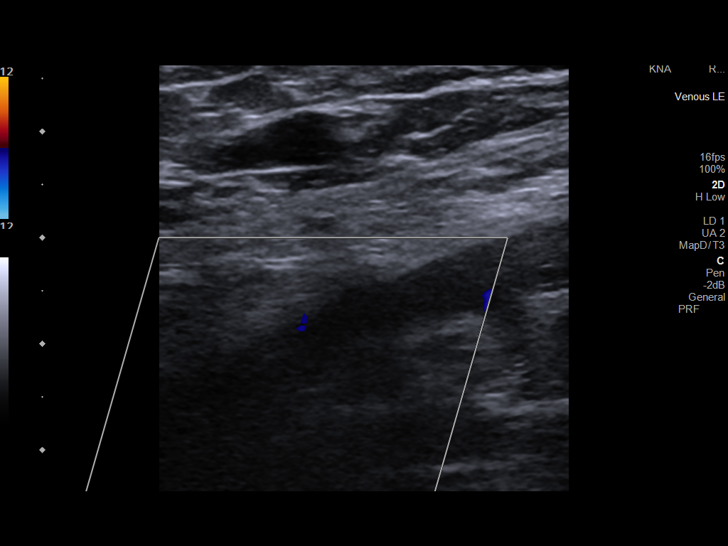
[im 50/89]
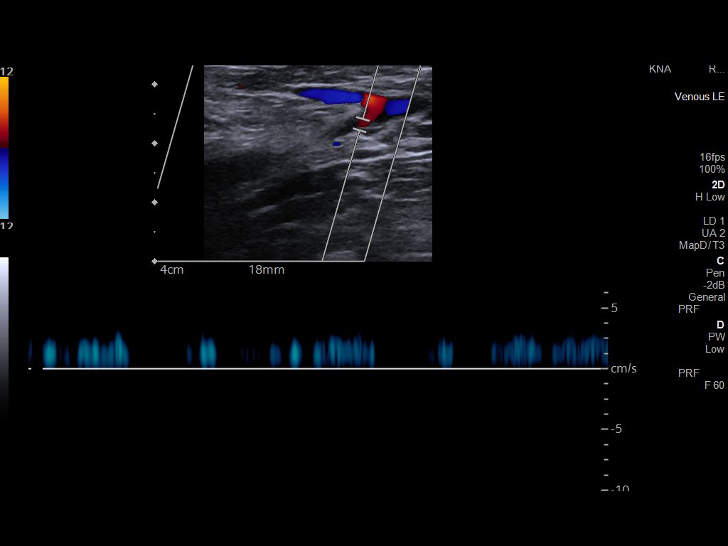
[im 58/89]
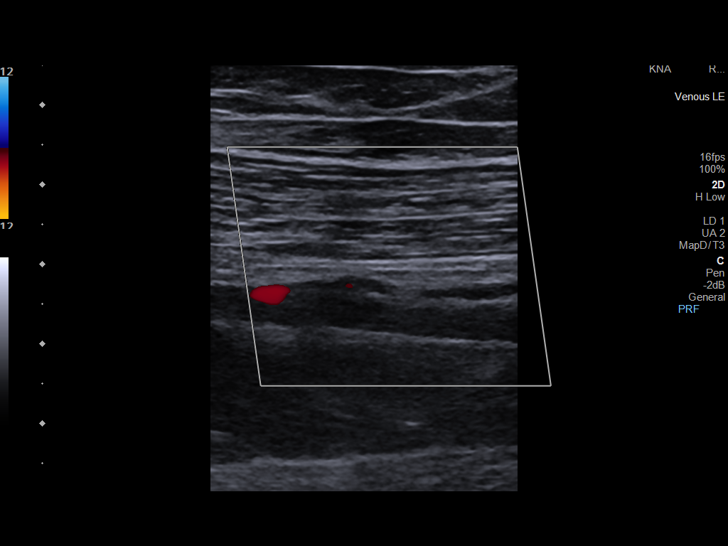
[im 66/89]
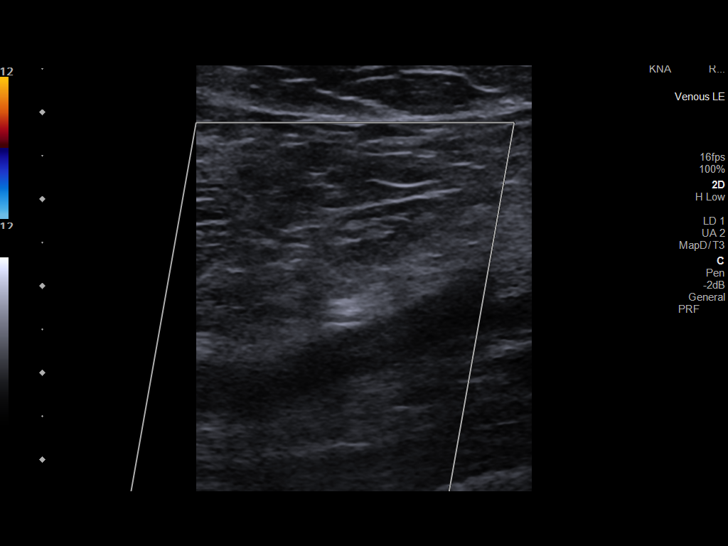
[im 73/89]
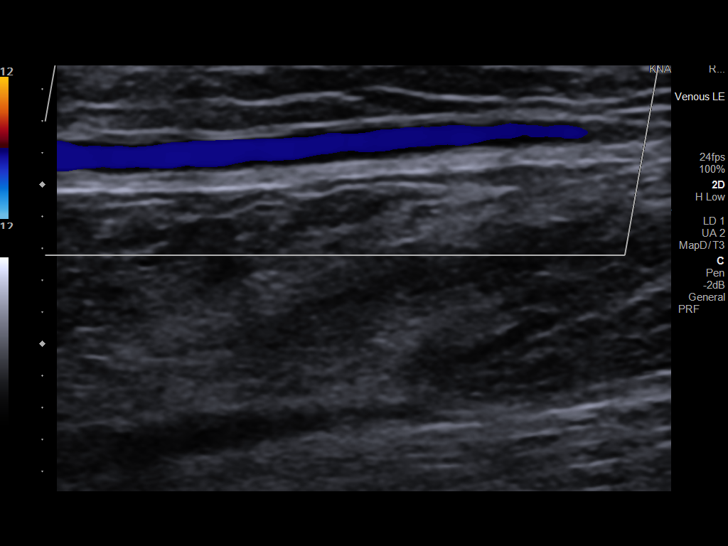
[im 81/89]
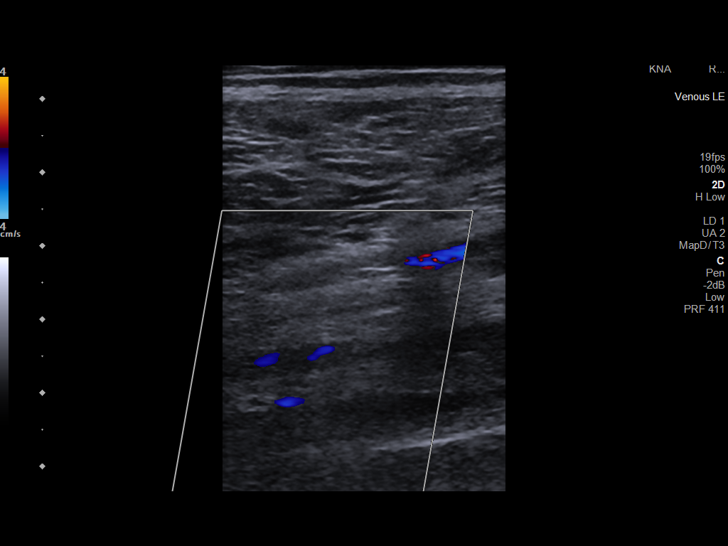
[im 89/89]
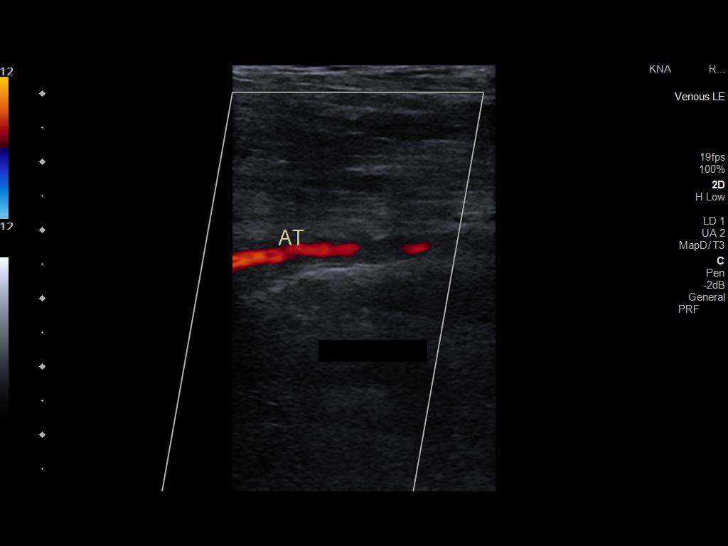

[13 of 24 positions shown; findings below may reference images not displayed]

FINDINGS: RIGHT LOWER EXTREMITY

Common Femoral Vein: No evidence of thrombus. Normal
compressibility, respiratory phasicity and response to augmentation.

Saphenofemoral Junction: No evidence of thrombus. Normal
compressibility and flow on color Doppler imaging.

Profunda Femoral Vein: No evidence of thrombus. Normal
compressibility and flow on color Doppler imaging.

There is mixed echogenic occlusive thrombus involving the proximal
(image 10), mid (image 17) and distal (image 21) aspects of the
right femoral vein, extending to involve the right popliteal vein
(image 23) as well as the imaged portions of the right posterior
tibial and peroneal veins (image 38).

Superficial Great Saphenous Vein: No evidence of thrombus. Normal
compressibility.

Other Findings: There is hypoechoic occlusive thrombus involving the
right lesser saphenous vein (image 27).

LEFT LOWER EXTREMITY

There is hypoechoic occlusive thrombus involving the left common
femoral vein (image 48), extending to involve the saphenofemoral
junction (image 50) as well as the imaged portions of the left deep
femoral vein (image 54).

There is hypoechoic occlusive thrombus involving the proximal (image
55), mid (image 59) and distal (image 63) aspects of the femoral
vein, extending to involve the left popliteal vein (image 67) as
well as the imaged aspects of the left tibial veins.

Superficial Great Saphenous Vein: No evidence of thrombus. Normal
compressibility.

Other Findings: There is near occlusive thrombus involving the left
lesser saphenous vein (image 71).
IMPRESSION: 1. The examination is positive for extensive predominantly occlusive
DVT extending from the proximal aspect of the right femoral vein
through the imaged right tibial veins.
2. Examination is positive for extensive predominantly occlusive DVT
extending from the left common femoral vein through the imaged left
tibial veins.
3. Examination is positive for occlusive/near occlusive superficial
thrombophlebitis involving the bilateral lesser saphenous veins.
